# Patient Record
Sex: Female | Born: 1980 | Race: Black or African American | Hispanic: No | Marital: Single | Smoking: Current every day smoker
Health system: Southern US, Community
[De-identification: ages and names within clinical notes are randomized; demographics above are authoritative.]

## PROBLEM LIST (undated history)

## (undated) DIAGNOSIS — I10 Essential (primary) hypertension: Secondary | ICD-10-CM

## (undated) DIAGNOSIS — Z789 Other specified health status: Secondary | ICD-10-CM

## (undated) HISTORY — PX: TONSILLECTOMY: SUR1361

## (undated) HISTORY — DX: Essential (primary) hypertension: I10

## (undated) HISTORY — DX: Other specified health status: Z78.9

## (undated) HISTORY — PX: KNEE SURGERY: SHX244

---

## 2005-03-28 ENCOUNTER — Emergency Department: Payer: Self-pay | Admitting: Emergency Medicine

## 2005-04-04 ENCOUNTER — Emergency Department: Payer: Self-pay | Admitting: Emergency Medicine

## 2005-07-14 ENCOUNTER — Emergency Department: Payer: Self-pay | Admitting: Emergency Medicine

## 2005-07-16 ENCOUNTER — Emergency Department: Payer: Self-pay | Admitting: Emergency Medicine

## 2005-08-21 ENCOUNTER — Emergency Department: Payer: Self-pay | Admitting: Internal Medicine

## 2006-10-04 IMAGING — CR DG CHEST 2V
1 series · 2 of 2 positions shown · non-contrast
Comparison: none

REASON FOR EXAM: Flu like symptoms, body aches. Rm 4
COMMENTS:

[Series 1: view not recorded · 0.17mm/px · 2 of 2 slices shown]
[im 1/2]
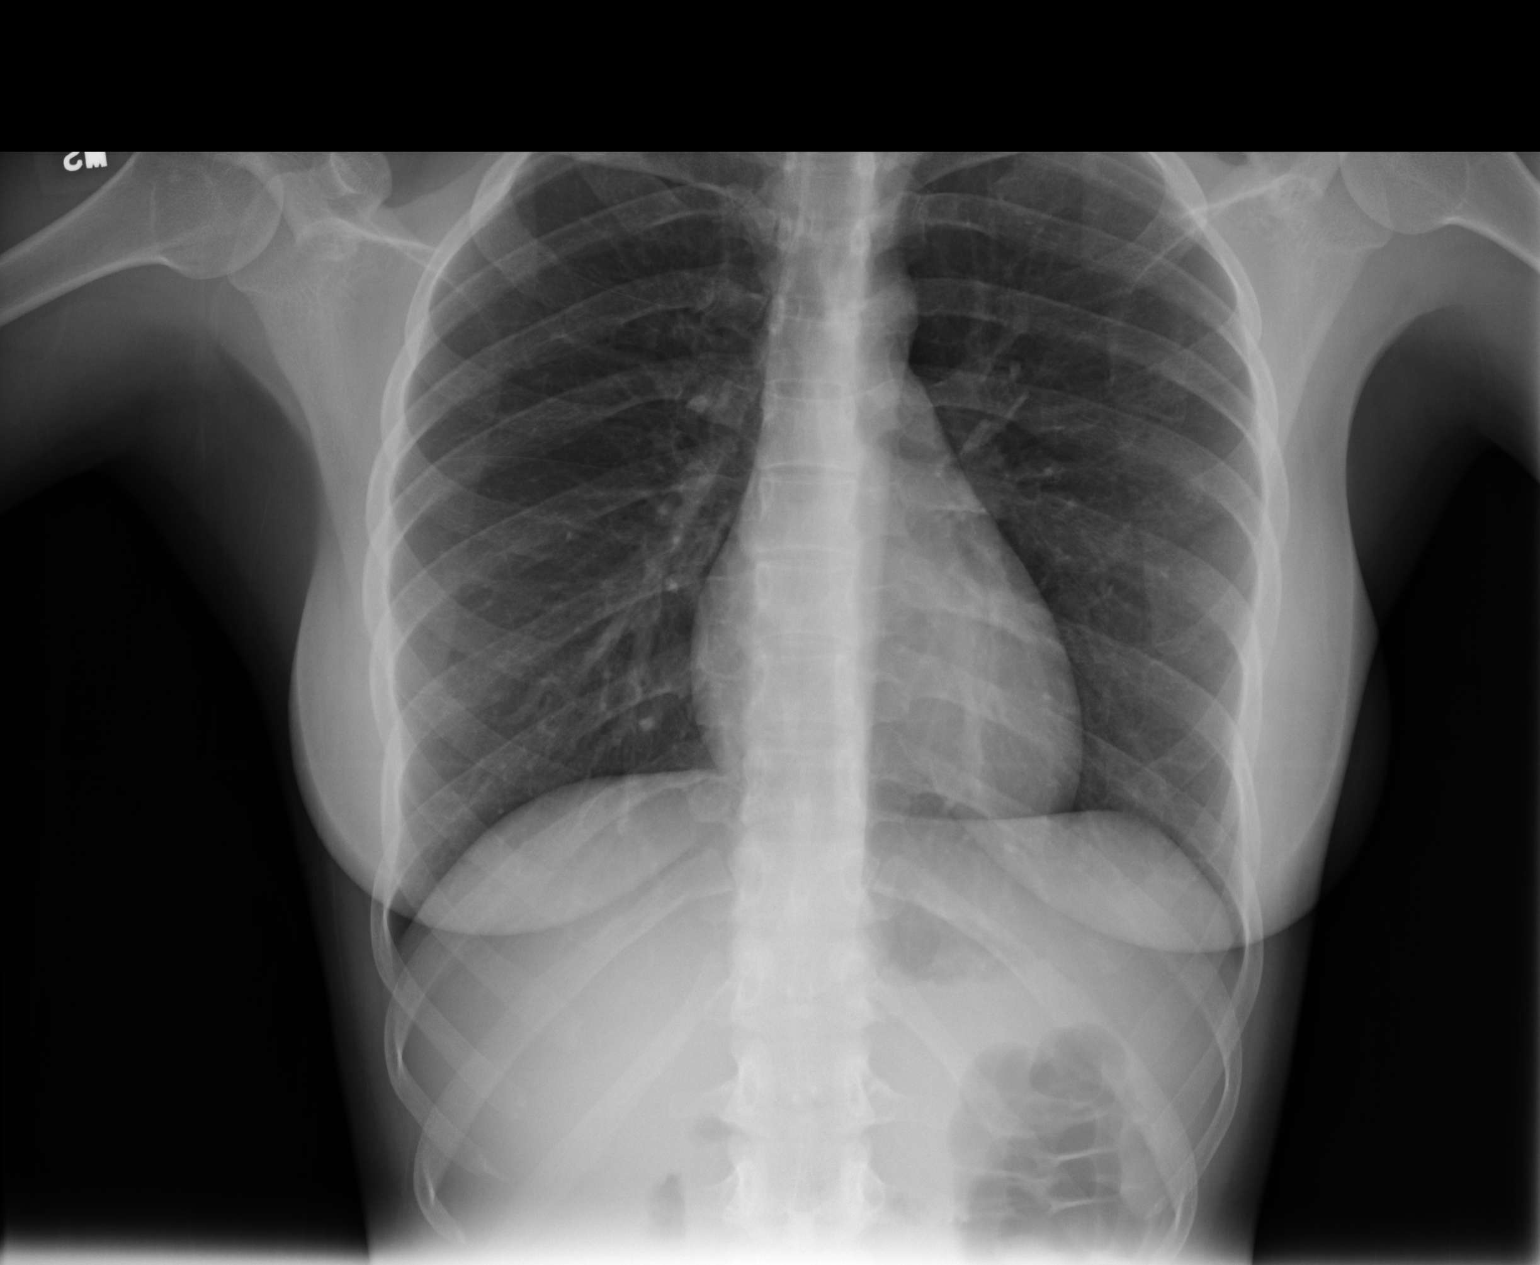
[im 2/2]
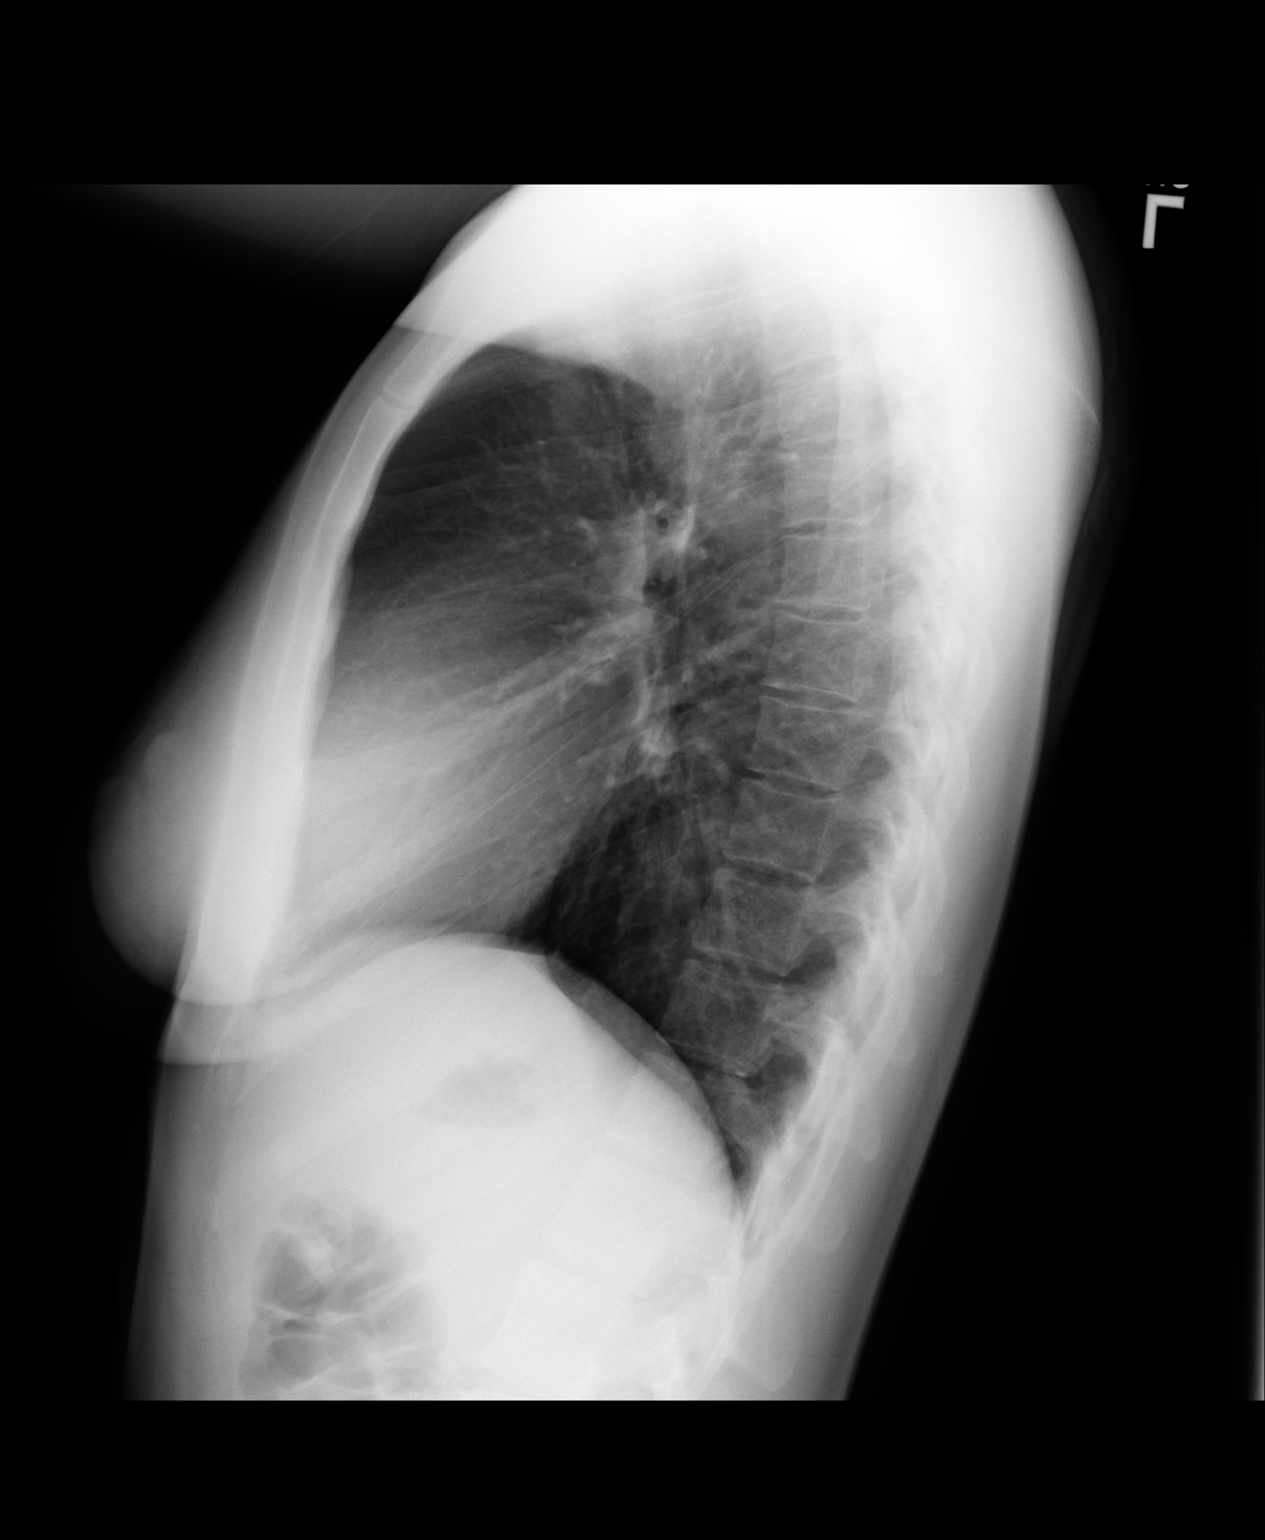

[2 of 2 positions shown; findings below may reference images not displayed]

PROCEDURE:     DXR - DXR CHEST PA (OR AP) AND LATERAL  - July 15, 2005  [DATE]

RESULT:     PA and lateral view reveals the cardiomediastinal structures to
be within normal limits.  The heart appears within normal limits in size.
The lung fields are clear.  A questionable tiny nodular density is noted in
the mid-LEFT lung field which does not appear calcified.  A chest CT would
be recommended.
IMPRESSION: No active infiltrate is seen.

Possible nodular density in the LEFT lung field.

## 2009-03-12 ENCOUNTER — Emergency Department: Payer: Self-pay | Admitting: Internal Medicine

## 2009-07-15 ENCOUNTER — Emergency Department: Payer: Self-pay | Admitting: Emergency Medicine

## 2009-08-24 ENCOUNTER — Emergency Department: Payer: Self-pay | Admitting: Emergency Medicine

## 2009-08-26 ENCOUNTER — Emergency Department: Payer: Self-pay | Admitting: Emergency Medicine

## 2010-06-17 ENCOUNTER — Emergency Department: Payer: Self-pay | Admitting: Emergency Medicine

## 2010-11-15 IMAGING — CT CT HEAD WITHOUT CONTRAST
2 series · 16 of 30 positions shown, 20 images · non-contrast
Comparison: none

REASON FOR EXAM: headache
COMMENTS:

PROCEDURE:     CT  - CT HEAD WITHOUT CONTRAST  - August 27, 2009 [DATE]
RESULT:
HISTORY: Headache.
COMPARISON STUDIES: No recent.
PROCEDURE AND FINDINGS: Non-enhanced Head CT was obtained. No intra-axial or
extra-axial pathologic fluid or blood collections identified.  No mass
lesions are noted. There is no hydrocephalus. No acute bony abnormalities
are identified.

[Series 2: without · axial · non-contrast · 0.41mm/px · z∈[-202,-82]mm · 13 of 30 slices shown, 17 images]
[im 3/30  brain]
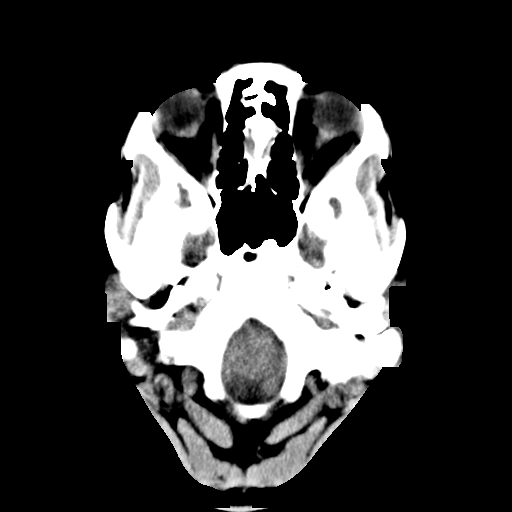
[im 3/30  bone]
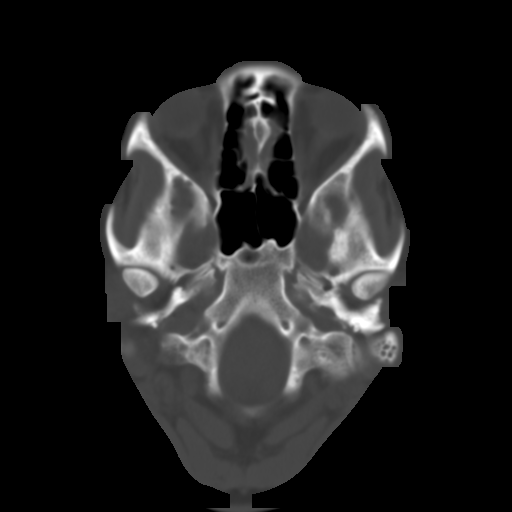
[im 5/30  brain]
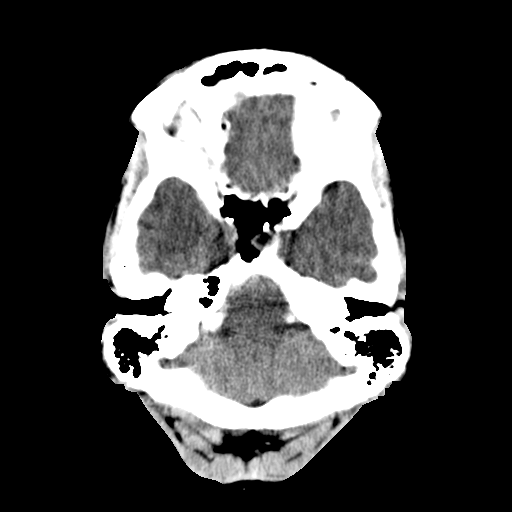
[im 7/30  brain]
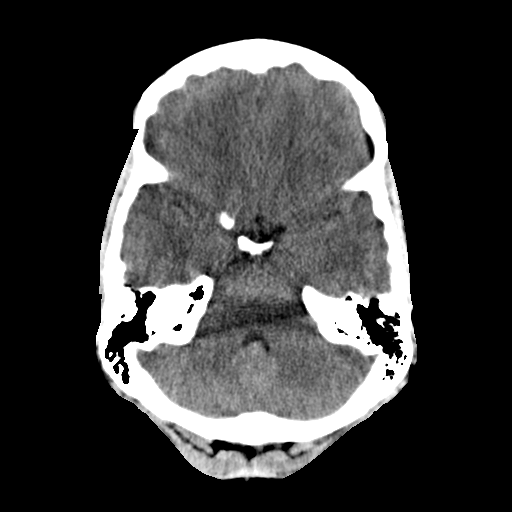
[im 9/30  brain]
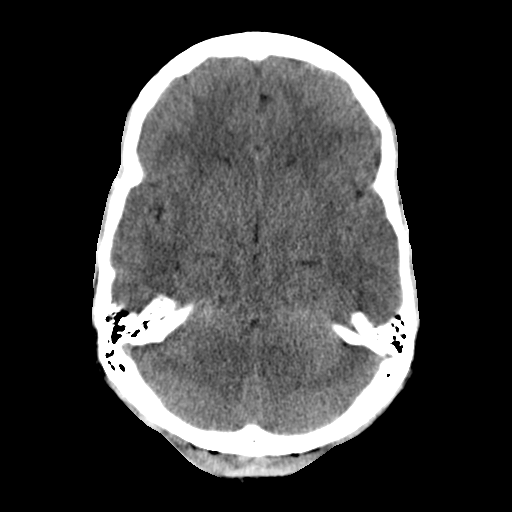
[im 11/30  brain]
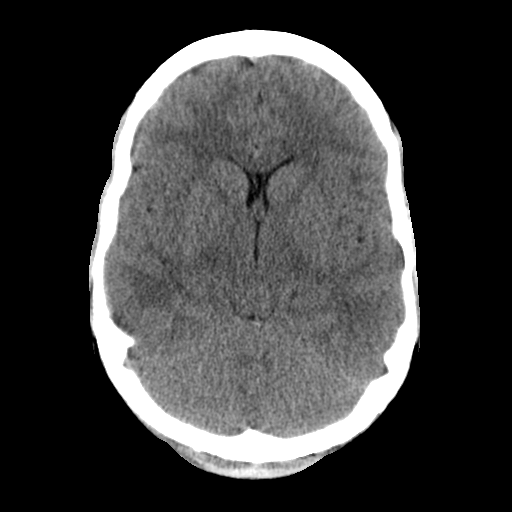
[im 11/30  bone]
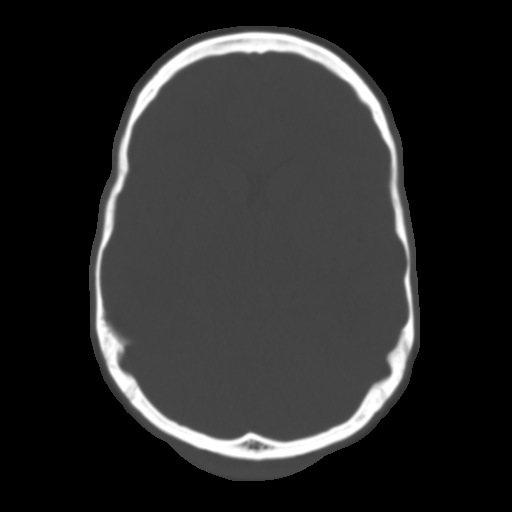
[im 13/30  brain]
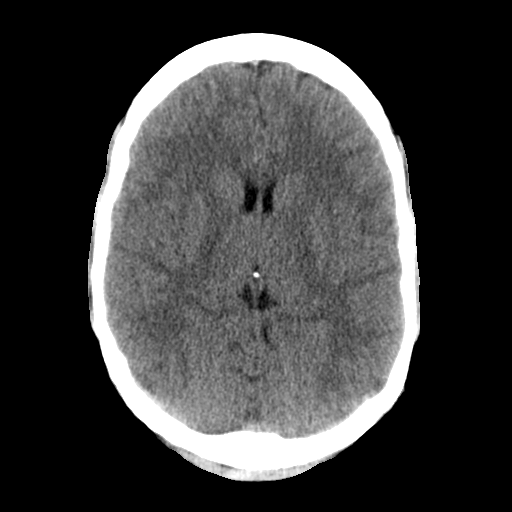
[im 15/30  brain]
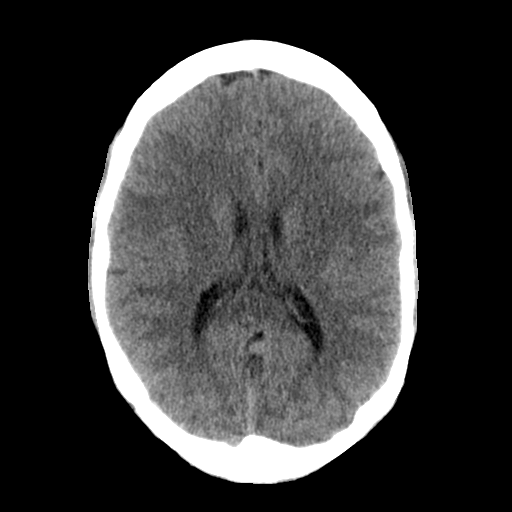
[im 17/30  brain]
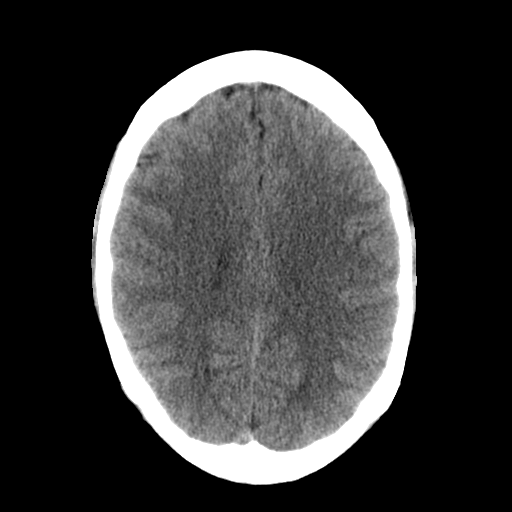
[im 19/30  brain]
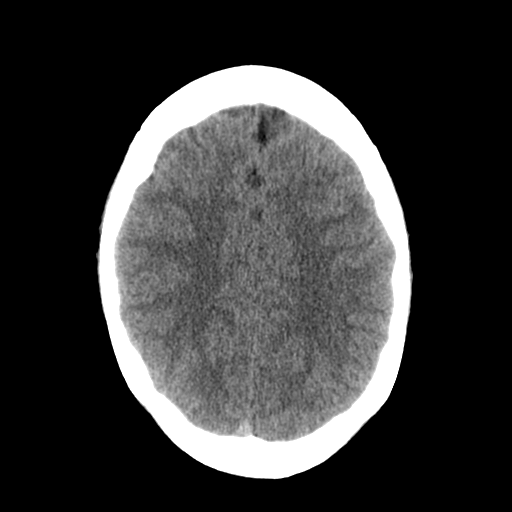
[im 19/30  bone]
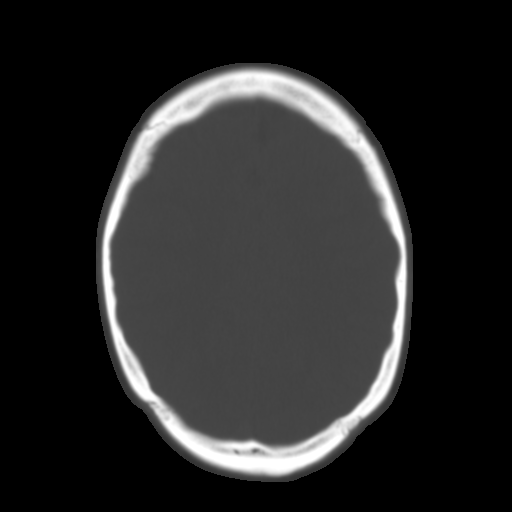
[im 21/30  brain]
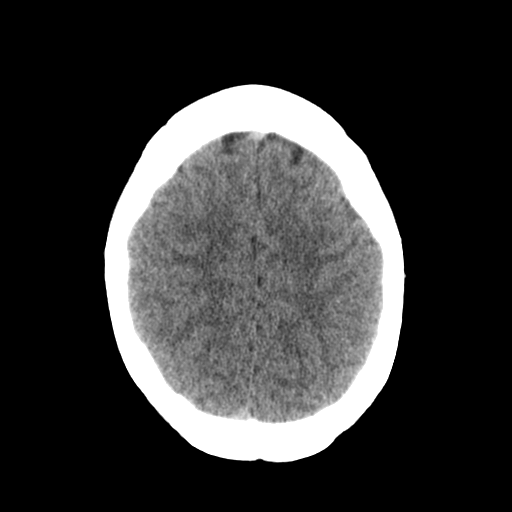
[im 23/30  brain]
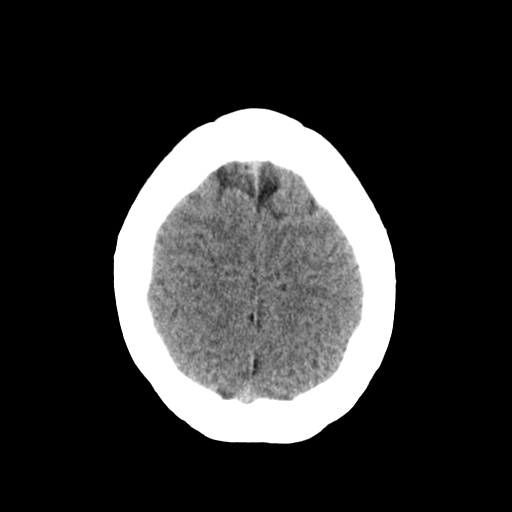
[im 25/30  brain]
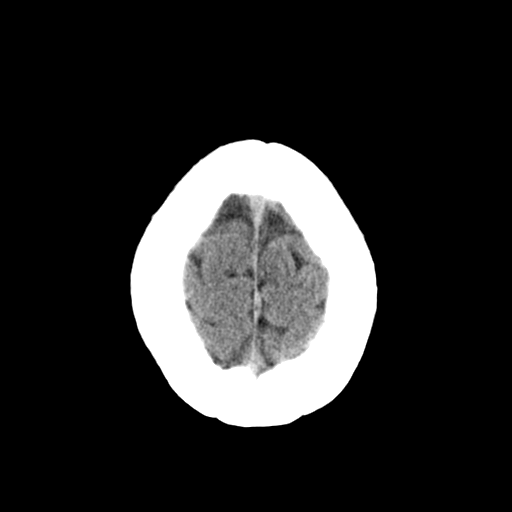
[im 27/30  brain]
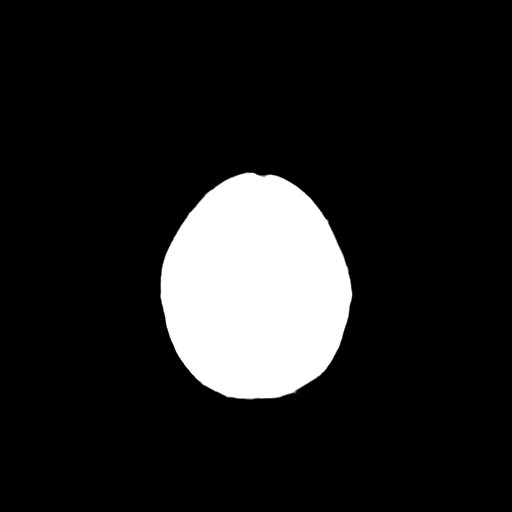
[im 27/30  bone]
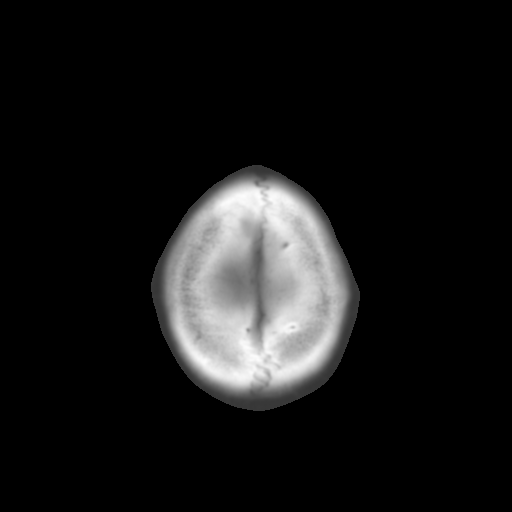

[Series 3: bone · axial · 0.41mm/px · z∈[-202,-162]mm · 3 of 30 slices shown]
[im 3/30  bone]
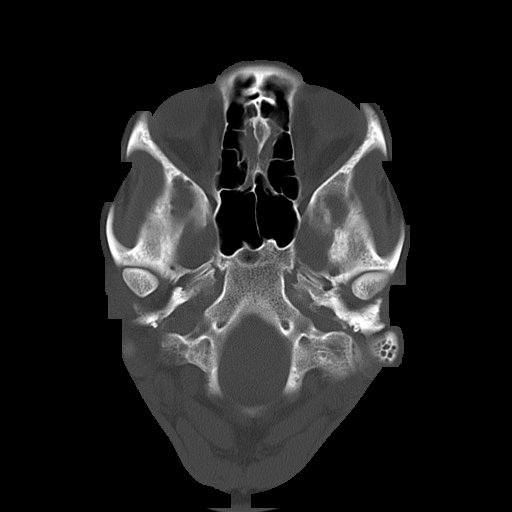
[im 7/30  bone]
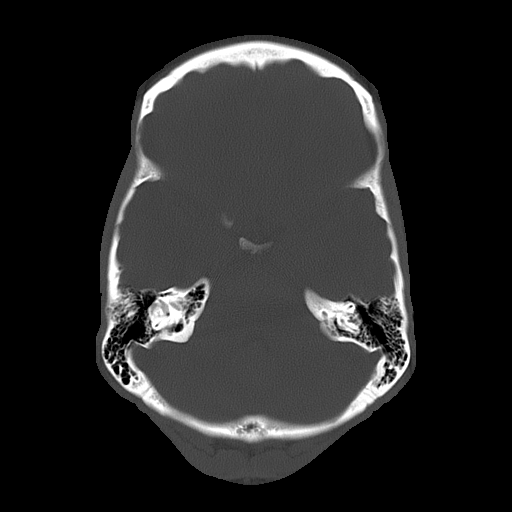
[im 11/30  bone]
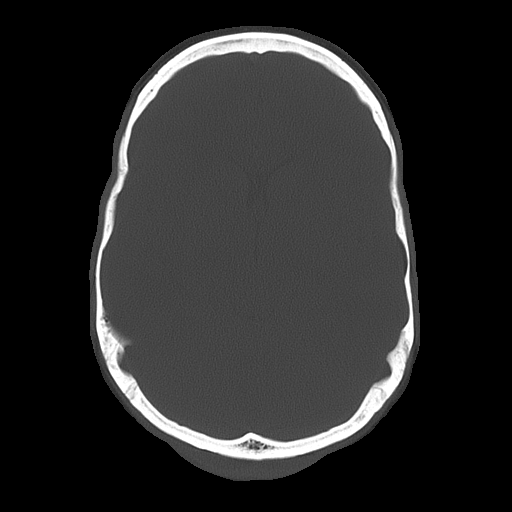

[16 of 30 positions shown; findings below may reference images not displayed]

IMPRESSION: No acute abnormality.

## 2011-01-22 ENCOUNTER — Emergency Department: Payer: Self-pay | Admitting: Emergency Medicine

## 2011-01-30 ENCOUNTER — Emergency Department: Payer: Self-pay | Admitting: Emergency Medicine

## 2011-08-25 ENCOUNTER — Inpatient Hospital Stay: Payer: Self-pay | Admitting: Obstetrics & Gynecology

## 2012-04-05 ENCOUNTER — Emergency Department: Payer: Self-pay | Admitting: Internal Medicine

## 2012-12-23 ENCOUNTER — Emergency Department: Payer: Self-pay | Admitting: Emergency Medicine

## 2013-06-23 ENCOUNTER — Emergency Department: Payer: Self-pay | Admitting: Emergency Medicine

## 2013-07-05 ENCOUNTER — Emergency Department: Payer: Self-pay | Admitting: Emergency Medicine

## 2013-10-27 ENCOUNTER — Emergency Department: Payer: Self-pay | Admitting: Emergency Medicine

## 2013-10-27 LAB — URINALYSIS, COMPLETE
BILIRUBIN, UR: NEGATIVE
Blood: NEGATIVE
Glucose,UR: NEGATIVE mg/dL (ref 0–75)
KETONE: NEGATIVE
LEUKOCYTE ESTERASE: NEGATIVE
NITRITE: NEGATIVE
Ph: 5 (ref 4.5–8.0)
Protein: NEGATIVE
RBC,UR: 3 /HPF (ref 0–5)
Specific Gravity: 1.026 (ref 1.003–1.030)
Squamous Epithelial: 4

## 2016-01-16 ENCOUNTER — Emergency Department
Admission: EM | Admit: 2016-01-16 | Discharge: 2016-01-16 | Disposition: A | Discharge status: Home / Self Care | Payer: Medicaid Other | Attending: Emergency Medicine | Admitting: Emergency Medicine

## 2016-01-16 ENCOUNTER — Encounter: Payer: Self-pay | Admitting: Medical Oncology

## 2016-01-16 DIAGNOSIS — L0291 Cutaneous abscess, unspecified: Secondary | ICD-10-CM | POA: Diagnosis present

## 2016-01-16 DIAGNOSIS — L089 Local infection of the skin and subcutaneous tissue, unspecified: Secondary | ICD-10-CM | POA: Diagnosis not present

## 2016-01-16 DIAGNOSIS — L739 Follicular disorder, unspecified: Secondary | ICD-10-CM

## 2016-01-16 DIAGNOSIS — B9689 Other specified bacterial agents as the cause of diseases classified elsewhere: Secondary | ICD-10-CM

## 2016-01-16 MED ORDER — SULFAMETHOXAZOLE-TRIMETHOPRIM 800-160 MG PO TABS: 1.0000 | ORAL_TABLET | Freq: Two times a day (BID) | ORAL | Status: AC

## 2016-01-16 MED ORDER — MUPIROCIN 2 % EX OINT: 1.0000 "application " | TOPICAL_OINTMENT | Freq: Two times a day (BID) | CUTANEOUS | Status: AC

## 2016-01-16 NOTE — Discharge Instructions (Signed)
Antibiotic Medicine Antibiotic medicines are used to treat infections caused by bacteria. They work by hurting or killing the germs that are making you sick. HOW WILL MY MEDICINE BE PICKED? There are many kinds of antibiotic medicines. To help your doctor pick one, tell your doctor if:  You have any allergies.  You are pregnant or plan to get pregnant.  You are breastfeeding.  You are taking any medicines. These include over-the-counter medicines, prescription medicines, and herbal remedies.  You have a medical condition or problem. If you have questions about why your medicine was picked, ask. FOR HOW LONG SHOULD I TAKE MY MEDICINE? Take your medicine for as long as your doctor tells you to. Do not stop taking it when you feel better. If you stop taking it too soon:  You may start to feel sick again.  Your infection may get harder to treat.  New problems may develop. WHAT IF I MISS A DOSE? Try not to miss any doses of antibiotic medicine. If you miss a dose:  Take the dose as soon as you can.  If you are taking 2 doses a day, take the next dose in 5 to 6 hours.  If you are taking 3 or more doses a day, take the next dose in 2 to 4 hours. Then go back to the normal schedule. If you cannot take a missed dose, take the next dose on time. Then take the missed dose after you have taken all the doses as told by your doctor, as if you had one more dose left. DOES THIS MEDICINE AFFECT BIRTH CONTROL? Birth control pills may not work while you are on antibiotic medicines. If you are taking birth control pills, keep taking them as usual. Use a second form of birth control, such as a condom. Keep using the second form of birth control until you are finished with your current 1 month cycle of birth control pills. GET HELP IF:  You get worse.  You do not feel better a few days after starting the medicine.  You throw up (vomit).  There are white patches in your mouth.  You have new  joint pain after starting the medicine.  You have new muscle aches after starting the medicine.  You had a fever before starting the medicine, and it comes back.  You have any symptoms of an allergic reaction, such as an itchy rash. If this happens, stop taking the medicine. GET HELP RIGHT AWAY IF:  Your pee (urine) turns dark or becomes blood-colored.  Your skin turns yellow.  You bruise or bleed easily.  You have very bad watery poop (diarrhea) and cramps in your belly (abdomen).  You have a very bad headache.  You have signs of a very bad allergic reaction, such as:  Trouble breathing.  Wheezing.  Swelling of the lips, tongue, or face.  Fainting.  Blisters on the skin or in the mouth. If you have signs of a very bad allergic reaction, stop taking the antibiotic medicine right away.   This information is not intended to replace advice given to you by your health care provider. Make sure you discuss any questions you have with your health care provider.   Document Released: 07/05/2008 Document Revised: 06/17/2015 Document Reviewed: 02/11/2015 Elsevier Interactive Patient Education 2016 Elsevier Inc.  Folliculitis Folliculitis is redness, soreness, and swelling (inflammation) of the hair follicles. This condition can occur anywhere on the body. People with weakened immune systems, diabetes, or obesity have a greater  risk of getting folliculitis. CAUSES  Bacterial infection. This is the most common cause.  Fungal infection.  Viral infection.  Contact with certain chemicals, especially oils and tars. Long-term folliculitis can result from bacteria that live in the nostrils. The bacteria may trigger multiple outbreaks of folliculitis over time. SYMPTOMS Folliculitis most commonly occurs on the scalp, thighs, legs, back, buttocks, and areas where hair is shaved frequently. An early sign of folliculitis is a small, white or yellow, pus-filled, itchy lesion (pustule).  These lesions appear on a red, inflamed follicle. They are usually less than 0.2 inches (5 mm) wide. When there is an infection of the follicle that goes deeper, it becomes a boil or furuncle. A group of closely packed boils creates a larger lesion (carbuncle). Carbuncles tend to occur in hairy, sweaty areas of the body. DIAGNOSIS  Your caregiver can usually tell what is wrong by doing a physical exam. A sample may be taken from one of the lesions and tested in a lab. This can help determine what is causing your folliculitis. TREATMENT  Treatment may include:  Applying warm compresses to the affected areas.  Taking antibiotic medicines orally or applying them to the skin.  Draining the lesions if they contain a large amount of pus or fluid.  Laser hair removal for cases of long-lasting folliculitis. This helps to prevent regrowth of the hair. HOME CARE INSTRUCTIONS  Apply warm compresses to the affected areas as directed by your caregiver.  If antibiotics are prescribed, take them as directed. Finish them even if you start to feel better.  You may take over-the-counter medicines to relieve itching.  Do not shave irritated skin.  Follow up with your caregiver as directed. SEEK IMMEDIATE MEDICAL CARE IF:   You have increasing redness, swelling, or pain in the affected area.  You have a fever. MAKE SURE YOU:  Understand these instructions.  Will watch your condition.  Will get help right away if you are not doing well or get worse.   This information is not intended to replace advice given to you by your health care provider. Make sure you discuss any questions you have with your health care provider.   Document Released: 12/05/2001 Document Revised: 10/17/2014 Document Reviewed: 12/27/2011 Elsevier Interactive Patient Education [employer] Elsevier Inc.

## 2016-01-16 NOTE — ED Notes (Signed)
Pt reports abscessed area to vaginal area.

## 2016-01-16 NOTE — ED Provider Notes (Signed)
[facility] Emergency Department Provider Note  ____________________________________________  Time seen: Approximately 10:19 AM  I have reviewed the triage vital signs and the nursing notes.   HISTORY  Chief Complaint Abscess    HPI Samantha Baker is a 35 y.o. female, NAD, presents to the emergency department with a few day history of skin sores to the vaginal area. States she had recurrent skin sores after shaving the area in the past, therefore she has stopped doing so. Over the last week she has noted ingrown hairs and small bumps that have occurred. One area is about the left labia which has been painful. Has not had any oozing or weeping. Patient has not pushed on these areas nor attempted to express fluid from them at home. States she has not been sexually active and well over 2 years as her significant other is currently incarcerated. She denies any pain or open wounds about the area. No history of STDs. Denies fever, chills, body aches, abdominal pain, nausea, vomiting. Has not had any changes in urinary patterns.   History reviewed. No pertinent past medical history.  There are no active problems to display for this patient.   No past surgical history on file.  Current Outpatient Rx  Name  Route  Sig  Dispense  Refill  . mupirocin ointment (BACTROBAN) 2 %   Topical   Apply 1 application topically 2 (two) times daily.   30 g   0   . sulfamethoxazole-trimethoprim (BACTRIM DS,SEPTRA DS) 800-160 MG tablet   Oral   Take 1 tablet by mouth 2 (two) times daily.   20 tablet   0     Allergies Penicillins  No family history on file.  Social History Social History  Substance Use Topics  . Smoking status: Never Smoker   . Smokeless tobacco: None  . Alcohol Use: None     Review of Systems  Constitutional: No fever/chills, fatigue Cardiovascular: No chest pain. Respiratory:  No shortness of breath.  Gastrointestinal: No abdominal pain.  No  nausea, vomiting. Genitourinary: Negative for dysuria, hematuria. No urinary hesitancy, urgency or increased frequency. Musculoskeletal: Negative for back, hip pain.  Skin: Positive skin sores. Negative for rash. Neurological: Negative for headaches, focal weakness or numbness. 10-point ROS otherwise negative.  ____________________________________________   PHYSICAL EXAM:  VITAL SIGNS: ED Triage Vitals  Enc Vitals Group     BP 01/16/16 0917 125/91 mmHg     Pulse Rate 01/16/16 0917 99     Resp 01/16/16 0917 18     Temp 01/16/16 0917 98.7 F (37.1 C)     Temp Source 01/16/16 0917 Oral     SpO2 01/16/16 0917 100 %     Weight 01/16/16 0917 190 lb (86.183 kg)     Height 01/16/16 0917  (1.651 m)     Head Cir --      Peak Flow --      Pain Score 01/16/16 0917 0     Pain Loc --      Pain Edu? --      Excl. in GC? --    Physical exam completed in the presence of Samantha Fill, PA-S  Constitutional: Alert and oriented. Well appearing and in no acute distress. Eyes: Conjunctivae are normal.  Head: Atraumatic. Hematological/Lymphatic/Immunilogical: No inguinal lymphadenopathy. Cardiovascular:  Good peripheral circulation. Respiratory: Normal respiratory effort without tachypnea or retractions.  Genitourinary:  Tanner stage V. Musculoskeletal: No lower extremity tenderness nor edema.  No joint effusions. Neurologic:  Normal speech and language. No gross focal neurologic deficits are appreciated.  Skin:  2 skin sores surrounding hair follicles noted about the vulva with 1 skin sore about the left labia also surrounding hair follicles. No active oozing or weeping. Skin sore about the left labia is mildly tender to palpation but no induration or fluctuance was noted. Skin is warm, dry and intact. No rash noted. Psychiatric: Mood and affect are normal. Speech and behavior are normal. Patient exhibits appropriate insight and  judgement.   ____________________________________________   LABS  None  ____________________________________________  EKG  None ____________________________________________  RADIOLOGY  None ____________________________________________    PROCEDURES  Procedure(s) performed: None    Medications - No data to display   ____________________________________________   INITIAL IMPRESSION / ASSESSMENT AND PLAN / ED COURSE  Patient's diagnosis is consistent with folliculitis and localized skin infection. Patient will be discharged home with prescriptions for Bactrim DS and bacitracin ointment to use as directed. Patient is to follow up with PCP if symptoms persist past this treatment course. Patient is given ED precautions to return to the ED for any worsening or new symptoms.    ____________________________________________  FINAL CLINICAL IMPRESSION(S) / ED DIAGNOSES  Final diagnoses:  Folliculitis  Localized bacterial skin infection      NEW MEDICATIONS STARTED DURING THIS VISIT:  Discharge Medication List as of 01/16/2016 10:25 AM    START taking these medications   Details  mupirocin ointment (BACTROBAN) 2 % Apply 1 application topically 2 (two) times daily., Starting 01/16/2016, Until Discontinued, Print    sulfamethoxazole-trimethoprim (BACTRIM DS,SEPTRA DS) 800-160 MG tablet Take 1 tablet by mouth 2 (two) times daily., Starting 01/16/2016, Until Discontinued, Print             Samantha PigeonJami L Hagler, PA-C 01/16/16 1153  Samantha Blazeravid Matthew Schaevitz, MD 01/16/16 [phone]

## 2016-01-16 NOTE — ED Notes (Signed)
NAD noted at time of D/C. Pt denies questions or concerns. Pt ambulatory to the lobby at this time.  

## 2017-03-21 ENCOUNTER — Encounter: Payer: Self-pay | Admitting: *Deleted

## 2017-03-21 ENCOUNTER — Emergency Department
Admission: EM | Admit: 2017-03-21 | Discharge: 2017-03-21 | Disposition: A | Discharge status: Home / Self Care | Payer: Medicaid Other | Attending: Emergency Medicine | Admitting: Emergency Medicine

## 2017-03-21 DIAGNOSIS — L739 Follicular disorder, unspecified: Secondary | ICD-10-CM | POA: Diagnosis not present

## 2017-03-21 DIAGNOSIS — N9089 Other specified noninflammatory disorders of vulva and perineum: Secondary | ICD-10-CM | POA: Diagnosis present

## 2017-03-21 NOTE — Discharge Instructions (Signed)
Please continue with antibiotics and follow up as needed for any increase in pain worsening symptoms urgent changes in her health.

## 2017-03-21 NOTE — ED Triage Notes (Signed)
Pt was seen at [facility] yesterday and dx with bartholins cyst  Pt taking septra.  Pt states it's not any better.  Pt alert.

## 2017-03-21 NOTE — ED Provider Notes (Signed)
[facility]-EMERGENCY DEPARTMENT Provider Note   CSN: [id number] Arrival date & time: 03/21/17  1915     History   Chief Complaint Chief Complaint  Patient presents with  . Bartholin's Cyst    HPI Samantha Baker is a 36 y.o. female presents to the emergency department today for concern of a Bartholin's cyst. Was seen one day ago diagnosed with Bartholin's cyst and placed on Bactrim DS. Today, patient states she has a lesion that is concerning for herpes. She denies any pain, warmth, erythema, swelling or drainage to the area. She has been taking Bactrim for the last 24 hours. Patient states initially when this lesion started there is a small pimple, she squeezed the area and removed some pus. No fevers, pain, vaginal discharge, dyspareunia.  HPI  No past medical history on file.  There are no active problems to display for this patient.   No past surgical history on file.  OB History    No data available       Home Medications    Prior to Admission medications   Medication Sig Start Date End Date Taking? Authorizing Provider  mupirocin ointment (BACTROBAN) 2 % Apply 1 application topically 2 (two) times daily. 01/16/16   Hagler, Jami L, PA-C  sulfamethoxazole-trimethoprim (BACTRIM DS,SEPTRA DS) 800-160 MG tablet Take 1 tablet by mouth 2 (two) times daily. 01/16/16   Hagler, Ernestene KielJami L, PA-C    Family History No family history on file.  Social History Social History  Substance Use Topics  . Smoking status: Never Smoker  . Smokeless tobacco: Never Used  . Alcohol use No     Allergies   Penicillins   Review of Systems Review of Systems  Constitutional: Negative for activity change, chills, fatigue and fever.  HENT: Negative for congestion, sinus pressure and sore throat.   Eyes: Negative for visual disturbance.  Respiratory: Negative for cough, chest tightness and shortness of breath.   Cardiovascular: Negative for chest pain and leg swelling.  Gastrointestinal: Negative  for abdominal pain, diarrhea, nausea and vomiting.  Genitourinary: Negative for dysuria.  Musculoskeletal: Negative for arthralgias and gait problem.  Skin: Positive for wound. Negative for rash.  Neurological: Negative for weakness, numbness and headaches.  Hematological: Negative for adenopathy.  Psychiatric/Behavioral: Negative for agitation, behavioral problems and confusion.     Physical Exam Updated Vital Signs BP 138/82 (BP Location: Left Arm)   Pulse 87   Temp 99.7 F (37.6 C) (Oral)   Resp 18   Ht 5\' 4"  (1.626 m)   Wt 83.9 kg (185 lb)   SpO2 100%   BMI 31.76 kg/m   Physical Exam  Constitutional: She is oriented to person, place, and time. She appears well-developed and well-nourished. No distress.  HENT:  Head: Normocephalic and atraumatic.  Mouth/Throat: Oropharynx is clear and moist.  Eyes: EOM are normal. Pupils are equal, round, and reactive to light. Right eye exhibits no discharge. Left eye exhibits no discharge.  Neck: Normal range of motion. Neck supple.  Cardiovascular: Normal rate, regular rhythm and intact distal pulses.   Pulmonary/Chest: Effort normal. No respiratory distress.  Abdominal: Soft. She exhibits no distension. There is no tenderness.  Genitourinary:  Genitourinary Comments: Examination of the external genitalia shows a small skin lesion along the right upper labia with no swelling, tenderness, fluctuance or induration. She is nontender to palpation. There is no vaginal discharge. No swelling along the Bartholin's region.  Musculoskeletal: Normal range of motion. She exhibits no edema.  Neurological: She is  alert and oriented to person, place, and time. She has normal reflexes.  Skin: Skin is warm and dry.  Psychiatric: She has a normal mood and affect. Her behavior is normal. Thought content normal.     ED Treatments / Results  Labs (all labs ordered are listed, but only abnormal results are displayed) Labs Reviewed - No data to  display  EKG  EKG Interpretation None       Radiology No results found.  Procedures Procedures (including critical care time)  Medications Ordered in ED Medications - No data to display   Initial Impression / Assessment and Plan / ED Course  I have reviewed the triage vital signs and the nursing notes.  Pertinent labs & imaging results that were available during my care of the patient were reviewed by me and considered in my medical decision making (see chart for details).     36 year old female with a lesion to the right vaginal area. There does not appear to be consistent with a Bartholin's cyst or herpes. Area is nontender to palpation and there is no swelling induration or fluctuance. History and current exam consistent with healing skin folliculitis. She'll continue Bactrim and follow up as needed.  Final Clinical Impressions(s) / ED Diagnoses   Final diagnoses:  Acute folliculitis    New Prescriptions New Prescriptions   No medications on file     Ronnette Juniper 03/21/17 1955    Merrily Brittle, MD 03/21/17 2043

## 2017-03-21 NOTE — ED Notes (Signed)
Alert, oriented, ambulatory. Pt asking for 2nd opinion. States she has had cysts in vaginal area before but states this one looks different. [facility]. Taken 2 doses. Thinks appearance of cyst has changed since yest. States he pulled a hair out of it last night.

## 2019-06-18 ENCOUNTER — Other Ambulatory Visit: Payer: Self-pay

## 2019-06-18 ENCOUNTER — Ambulatory Visit (LOCAL_COMMUNITY_HEALTH_CENTER): Payer: Medicaid Other

## 2019-06-18 VITALS — BP 138/88 | Ht 62.0 in | Wt 215.0 lb

## 2019-06-18 DIAGNOSIS — Z30013 Encounter for initial prescription of injectable contraceptive: Secondary | ICD-10-CM | POA: Diagnosis not present

## 2019-06-18 DIAGNOSIS — Z3009 Encounter for other general counseling and advice on contraception: Secondary | ICD-10-CM | POA: Diagnosis not present

## 2019-06-18 MED ORDER — MEDROXYPROGESTERONE ACETATE 150 MG/ML IM SUSP
150.0000 mg | Freq: Once | INTRAMUSCULAR | Status: AC
  Administered 2019-06-18: 16:00:00 150 mg via INTRAMUSCULAR

## 2019-06-18 NOTE — Progress Notes (Signed)
Last physical with [facility] 12/21/2018. Last depo with [facility] 03/22/2019; 12.4 weeks post depo. PAP due 04/2019 per [facility] records/notes (04/21/2014 PAP Normal, HPV negative). DMPA 150 mg IM per Jerline Pain, FNP order dated 12/21/2018.

## 2019-09-03 ENCOUNTER — Ambulatory Visit (LOCAL_COMMUNITY_HEALTH_CENTER): Payer: Medicaid Other

## 2019-09-03 ENCOUNTER — Other Ambulatory Visit: Payer: Self-pay

## 2019-09-03 VITALS — BP 132/78 | Ht 62.0 in | Wt 214.5 lb

## 2019-09-03 DIAGNOSIS — Z30013 Encounter for initial prescription of injectable contraceptive: Secondary | ICD-10-CM

## 2019-09-03 DIAGNOSIS — Z3009 Encounter for other general counseling and advice on contraception: Secondary | ICD-10-CM | POA: Diagnosis not present

## 2019-09-03 DIAGNOSIS — Z3042 Encounter for surveillance of injectable contraceptive: Secondary | ICD-10-CM

## 2019-09-03 MED ORDER — MEDROXYPROGESTERONE ACETATE 150 MG/ML IM SUSP
150.0000 mg | Freq: Once | INTRAMUSCULAR | Status: AC
  Administered 2019-09-03: 150 mg via INTRAMUSCULAR

## 2019-09-03 MED ORDER — MULTIVITAMINS PO CAPS: 1.0000 | ORAL_CAPSULE | Freq: Every day | ORAL | 0 refills | Status: AC

## 2019-09-03 NOTE — Progress Notes (Signed)
Last depo at [facility] was 06/18/2019; 11.0 weeks post depo today. DMPA 150 mg IM per Jerline Pain, FNP order dated 12/21/2018 (at [facility]).

## 2019-11-21 ENCOUNTER — Ambulatory Visit (LOCAL_COMMUNITY_HEALTH_CENTER): Payer: Medicaid Other

## 2019-11-21 ENCOUNTER — Other Ambulatory Visit: Payer: Self-pay

## 2019-11-21 VITALS — BP 127/82 | Ht 62.0 in | Wt 214.2 lb

## 2019-11-21 DIAGNOSIS — Z30013 Encounter for initial prescription of injectable contraceptive: Secondary | ICD-10-CM | POA: Diagnosis not present

## 2019-11-21 DIAGNOSIS — Z3009 Encounter for other general counseling and advice on contraception: Secondary | ICD-10-CM

## 2019-11-21 DIAGNOSIS — Z3042 Encounter for surveillance of injectable contraceptive: Secondary | ICD-10-CM

## 2019-11-21 MED ORDER — MEDROXYPROGESTERONE ACETATE 150 MG/ML IM SUSP
150.0000 mg | Freq: Once | INTRAMUSCULAR | Status: AC
  Administered 2019-11-21: 10:00:00 150 mg via INTRAMUSCULAR

## 2019-11-21 NOTE — Addendum Note (Signed)
Addended by: Tawny Hopping A on: 11/21/2019 11:43 AM   Modules accepted: Level of Service

## 2019-11-21 NOTE — Progress Notes (Signed)
Here today for Depo. Last PE was 12/21/2018 and last Pap was 04/11/2014. Depo given per 12/21/2018 order. Patient tolerated well. To return around 02/06/2020 for Depo and Physical. Tawny Hopping, RN

## 2020-02-14 ENCOUNTER — Other Ambulatory Visit: Payer: Self-pay

## 2020-02-14 ENCOUNTER — Ambulatory Visit (LOCAL_COMMUNITY_HEALTH_CENTER): Payer: Medicaid Other

## 2020-02-14 VITALS — BP 130/85 | Ht 62.0 in | Wt 216.5 lb

## 2020-02-14 DIAGNOSIS — Z30013 Encounter for initial prescription of injectable contraceptive: Secondary | ICD-10-CM | POA: Diagnosis not present

## 2020-02-14 DIAGNOSIS — Z3042 Encounter for surveillance of injectable contraceptive: Secondary | ICD-10-CM

## 2020-02-14 DIAGNOSIS — Z3009 Encounter for other general counseling and advice on contraception: Secondary | ICD-10-CM | POA: Diagnosis not present

## 2020-02-14 MED ORDER — MEDROXYPROGESTERONE ACETATE 150 MG/ML IM SUSP
150.0000 mg | Freq: Once | INTRAMUSCULAR | Status: AC
  Administered 2020-02-14: 150 mg via INTRAMUSCULAR

## 2020-02-14 NOTE — Progress Notes (Signed)
Pt is 12.1 weeks post depo today. DMPA 150 mg IM administered today per Dr. Lyndel Safe standing order for one time depo when physical is due (reference Jerilee Hoh, FNP order for 1 year of depo dated 01/08/2019 in [facility]).

## 2020-05-01 ENCOUNTER — Ambulatory Visit (LOCAL_COMMUNITY_HEALTH_CENTER): Payer: Medicaid Other | Admitting: Advanced Practice Midwife

## 2020-05-01 ENCOUNTER — Other Ambulatory Visit: Payer: Self-pay

## 2020-05-01 VITALS — BP 133/88 | Ht 62.0 in

## 2020-05-01 DIAGNOSIS — R030 Elevated blood-pressure reading, without diagnosis of hypertension: Secondary | ICD-10-CM

## 2020-05-01 DIAGNOSIS — E669 Obesity, unspecified: Secondary | ICD-10-CM

## 2020-05-01 DIAGNOSIS — Z3042 Encounter for surveillance of injectable contraceptive: Secondary | ICD-10-CM

## 2020-05-01 DIAGNOSIS — Z3009 Encounter for other general counseling and advice on contraception: Secondary | ICD-10-CM | POA: Diagnosis not present

## 2020-05-01 DIAGNOSIS — Z30013 Encounter for initial prescription of injectable contraceptive: Secondary | ICD-10-CM | POA: Diagnosis not present

## 2020-05-01 DIAGNOSIS — F17200 Nicotine dependence, unspecified, uncomplicated: Secondary | ICD-10-CM

## 2020-05-01 MED ORDER — MEDROXYPROGESTERONE ACETATE 150 MG/ML IM SUSP
150.0000 mg | Freq: Once | INTRAMUSCULAR | Status: AC
  Administered 2020-05-01: 150 mg via INTRAMUSCULAR

## 2020-05-01 NOTE — Progress Notes (Signed)
Wet mount reviewed, no tx per standing order. Provided pt with BP readings from today. Provider reviewed BP. Pt has PCP list. Per provider, no instructions to follow-up with PCP at this time.  Reviewed some common signs and symptoms of high blood pressure, pt has cuff at home to be able to monitor BP. Provider orders completed.

## 2020-05-01 NOTE — Progress Notes (Signed)
[facility] DEPARTMENT [facility] [address] Main Number: [phone]    Family Planning Visit- Initial Visit  Subjective:  Samantha Baker is a 39 y.o. engaged BF smoker G1P1001 (66 yo son)  being seen today for an initial well woman visit and to discuss family planning options.  Not working.  Living with son, fiance, and nephew who she has custody of.    She is currently using Depo Provera for pregnancy prevention. Patient reports she does not want a pregnancy in the next year.  Patient has the following medical conditions has Obesity 216 lbs; Elevated BP without diagnosis of hypertension 130/89 on 05/01/20; and Smoker 1/2-1 ppd on their problem list.  Chief Complaint  Patient presents with  . Annual Exam  . Contraception    DEPO (in hip)    Patient reports no LMP on DMPA.  Last pap 04/21/14 neg.  Last sex 04/23/20 with condom; with current partner x 10 years.  Last ETOH 03/29/20 (3 glasses wine) 1x/mo.  Last DMPA 02/14/20  Patient denies drug use.  Body mass index is 39.6 kg/m. - Patient is eligible for diabetes screening based on BMI and age >22?  not applicable HA1C ordered? not applicable  Patient reports 1 of partners in last year. Desires STI screening?  Yes  Has patient been screened once for HCV in the past?  No  No results found for: HCVAB  Does the patient have current drug use (including MJ), have a partner with drug use, and/or has been incarcerated since last result? No  If yes-- Screen for HCV through [facility]   Does the patient meet criteria for HBV testing? No  Criteria:  -Household, sexual or needle sharing contact with HBV -History of drug use -HIV positive -Those with known Hep C   Health Maintenance Due  Topic Date Due  . Hepatitis C Screening  Never done  . COVID-19 Vaccine (1) Never done  . HIV Screening  Never done  . TETANUS/TDAP  Never done  . PAP SMEAR-Modifier  Never done    Review of Systems  All  other systems reviewed and are negative.   The following portions of the patient's history were reviewed and updated as appropriate: allergies, current medications, past family history, past medical history, past social history, past surgical history and problem list. Problem list updated.   See flowsheet for other program required questions.  Objective:   Vitals:   05/01/20 1552 05/01/20 1604  BP: (!) 141/86 (!) 130/89  Height: 5\' 2"  (1.575 m)     Physical Exam Constitutional:      Appearance: Normal appearance. She is obese.  HENT:     Head: Normocephalic and atraumatic.     Mouth/Throat:     Mouth: Mucous membranes are moist.  Eyes:     Conjunctiva/sclera: Conjunctivae normal.  Cardiovascular:     Rate and Rhythm: Normal rate and regular rhythm.  Pulmonary:     Effort: Pulmonary effort is normal.     Breath sounds: Normal breath sounds.  Abdominal:     Palpations: Abdomen is soft.     Comments: Soft without tenderness, increased adipose, poor tone  Genitourinary:    General: Normal vulva.     Exam position: Lithotomy position.     Vagina: Bleeding (red spotting) present.     Cervix: Normal.     Uterus: Normal.      Adnexa: Right adnexa normal and left adnexa normal.  Rectum: Normal.     Comments: Pap done Musculoskeletal:        General: Normal range of motion.     Cervical back: Normal range of motion and neck supple.  Skin:    General: Skin is warm and dry.  Neurological:     Mental Status: She is alert.  Psychiatric:        Mood and Affect: Mood normal.       Assessment and Plan:  Samantha Baker is a 39 y.o. female presenting to the [facility] Department for an initial well woman exam/family planning visit  Contraception counseling: Reviewed all forms of birth control options in the tiered based approach. available including abstinence; over the counter/barrier methods; hormonal contraceptive medication including pill, patch, ring,  injection,contraceptive implant, ECP; hormonal and nonhormonal IUDs; permanent sterilization options including vasectomy and the various tubal sterilization modalities. Risks, benefits, and typical effectiveness rates were reviewed.  Questions were answered.  Written information was also given to the patient to review.  Patient desires DMPA, this was prescribed for patient. She will follow up in 11-13 wks for surveillance.  She was told to call with any further questions, or with any concerns about this method of contraception.  Emphasized use of condoms 100% of the time for STI prevention.  Patient was not offered ECP. ECP was not accepted by the patient. ECP counseling was not given - see RN documentation  1. Family planning Please repeat BP (130/89) Treat wet mount per standing orders Immunization nurse consult - WET PREP FOR TRICH, YEAST, CLUE - Chlamydia/Gonorrhea [facility] [facility] - IGP, Aptima HPV  2. Encounter for surveillance of injectable contraceptive DMPA 150 mg IM q 11-13 wks x 1 year Counseled via 5 A's to stop smoking  3. Obesity, unspecified classification, unspecified obesity type, unspecified whether serious comorbidity present      Return for 11-13 wk DMPA.  No future appointments.  Alberteen Spindle, CNM

## 2020-05-01 NOTE — Progress Notes (Signed)
Pt to clinic for physical and depo. Pt is 11.0 weeks post depo today. 

## 2020-05-02 LAB — WET PREP FOR TRICH, YEAST, CLUE
Trichomonas Exam: NEGATIVE
Yeast Exam: NEGATIVE

## 2020-05-06 LAB — IGP, APTIMA HPV
HPV Aptima: NEGATIVE
PAP Smear Comment: 0

## 2020-07-17 ENCOUNTER — Ambulatory Visit (LOCAL_COMMUNITY_HEALTH_CENTER): Payer: Medicaid Other

## 2020-07-17 ENCOUNTER — Other Ambulatory Visit: Payer: Self-pay

## 2020-07-17 VITALS — BP 139/97 | Ht 62.0 in | Wt 217.0 lb

## 2020-07-17 DIAGNOSIS — Z3009 Encounter for other general counseling and advice on contraception: Secondary | ICD-10-CM

## 2020-07-17 DIAGNOSIS — Z30013 Encounter for initial prescription of injectable contraceptive: Secondary | ICD-10-CM | POA: Diagnosis not present

## 2020-07-17 DIAGNOSIS — Z3042 Encounter for surveillance of injectable contraceptive: Secondary | ICD-10-CM

## 2020-07-17 MED ORDER — MEDROXYPROGESTERONE ACETATE 150 MG/ML IM SUSP
150.0000 mg | Freq: Once | INTRAMUSCULAR | Status: AC
  Administered 2020-07-17: 150 mg via INTRAMUSCULAR

## 2020-07-17 NOTE — Progress Notes (Signed)
11 weeks post depo. BP  elevated today at 139/97 and retaken 10 min. later at 140/95. Pt reports smoking and drinking a pepsi directly before appt today. States she takes her BP at home and it has always been "normal", but did not know the BP numbers. States her blood pressure rises when she is at doctor's office. Consult with Beatris Si, PA who says ok to proceed with depo today per order by Hazle Coca, CNM dated 05/01/20. DMPA 150mg  IM given today Left UOQ without difficulty. RN counseled pt on healthy lifestyle changes, such as exercise, reducing sodium and fat in diet, and no smoking. Pt agrees to keep record of BP she takes at home and to see PCP if questions/concerns.  Depo due 10/02/20.  Has depo reminder card  10/04/20, RN

## 2020-07-17 NOTE — Progress Notes (Signed)
Consulted by RN re:  Patient with elevated BP and here for Depo.  Reviewed RN note and agree that it reflects discussion, recommendations and plan.

## 2020-10-06 ENCOUNTER — Ambulatory Visit (LOCAL_COMMUNITY_HEALTH_CENTER): Payer: Medicaid Other

## 2020-10-06 ENCOUNTER — Other Ambulatory Visit: Payer: Self-pay

## 2020-10-06 VITALS — BP 128/83 | Ht 62.0 in | Wt 215.0 lb

## 2020-10-06 DIAGNOSIS — Z30013 Encounter for initial prescription of injectable contraceptive: Secondary | ICD-10-CM | POA: Diagnosis not present

## 2020-10-06 DIAGNOSIS — Z3009 Encounter for other general counseling and advice on contraception: Secondary | ICD-10-CM

## 2020-10-06 DIAGNOSIS — Z3042 Encounter for surveillance of injectable contraceptive: Secondary | ICD-10-CM

## 2020-10-06 MED ORDER — MEDROXYPROGESTERONE ACETATE 150 MG/ML IM SUSP
150.0000 mg | Freq: Once | INTRAMUSCULAR | Status: AC
  Administered 2020-10-06: 150 mg via INTRAMUSCULAR

## 2020-10-06 NOTE — Progress Notes (Signed)
11 weeks 4 days post depo. Voices no problems. Reports she has been checking BP at home and it has been <140/90 consistently. States she has not seen PCP for BP evaluation. BP today WNL at 128/83. DMPA 150 mg IM given today Right UOQ per order by Hazle Coca, CNM dated 05/01/2020. Tolerated well. Next depo due 12/22/2020, pt aware. Jerel Shepherd, RN

## 2020-12-24 ENCOUNTER — Other Ambulatory Visit: Payer: Self-pay

## 2020-12-24 ENCOUNTER — Ambulatory Visit (LOCAL_COMMUNITY_HEALTH_CENTER): Payer: Medicaid Other

## 2020-12-24 VITALS — BP 140/87 | Ht 62.0 in | Wt 220.5 lb

## 2020-12-24 DIAGNOSIS — Z3042 Encounter for surveillance of injectable contraceptive: Secondary | ICD-10-CM

## 2020-12-24 DIAGNOSIS — Z3009 Encounter for other general counseling and advice on contraception: Secondary | ICD-10-CM

## 2020-12-24 MED ORDER — MEDROXYPROGESTERONE ACETATE 150 MG/ML IM SUSP
150.0000 mg | Freq: Once | INTRAMUSCULAR | Status: AC
  Administered 2020-12-24: 150 mg via INTRAMUSCULAR

## 2020-12-24 NOTE — Progress Notes (Signed)
11 weeks 2 days post depo. Reports she takes her BP regularly at home and keeps a log of BP readings and it has been <140/90. BP today 140/87 and pt reports being under great stress today.  Local provider resource list given per pt request as she is looking for new PCP. Depo given today per order by Hazle Coca, CNM dated 05/01/2020. Depo given L UOQ and tolerated well. Next depo due 03/11/2021, pt aware. Jerel Shepherd, RN

## 2021-03-11 ENCOUNTER — Other Ambulatory Visit: Payer: Self-pay

## 2021-03-11 ENCOUNTER — Ambulatory Visit (LOCAL_COMMUNITY_HEALTH_CENTER): Payer: Medicaid Other | Admitting: Nurse Practitioner

## 2021-03-11 VITALS — BP 138/98 | Ht 62.0 in | Wt 223.2 lb

## 2021-03-11 DIAGNOSIS — Z3009 Encounter for other general counseling and advice on contraception: Secondary | ICD-10-CM

## 2021-03-11 DIAGNOSIS — Z3042 Encounter for surveillance of injectable contraceptive: Secondary | ICD-10-CM | POA: Diagnosis not present

## 2021-03-11 MED ORDER — MEDROXYPROGESTERONE ACETATE 150 MG/ML IM SUSP
150.0000 mg | Freq: Once | INTRAMUSCULAR | Status: AC
  Administered 2021-03-11: 150 mg via INTRAMUSCULAR

## 2021-03-11 NOTE — Progress Notes (Signed)
40 year old female seen today for a depo.  Last depo was 12/24/2020 (11w).  No problems noted.  Depo given per 05/01/20 order Arnetha Courser, CNM.  Next depo and physical due 05/27/21.  BP elevated during visit consulted with provider Wendi Snipes, FNP, ok to give depo today.   Glenna Fellows, RN

## 2021-05-28 ENCOUNTER — Other Ambulatory Visit: Payer: Self-pay

## 2021-05-28 ENCOUNTER — Ambulatory Visit (LOCAL_COMMUNITY_HEALTH_CENTER): Payer: Medicaid Other | Admitting: Advanced Practice Midwife

## 2021-05-28 VITALS — BP 130/77 | Ht 64.0 in | Wt 223.6 lb

## 2021-05-28 DIAGNOSIS — Z3009 Encounter for other general counseling and advice on contraception: Secondary | ICD-10-CM

## 2021-05-28 DIAGNOSIS — Z3042 Encounter for surveillance of injectable contraceptive: Secondary | ICD-10-CM | POA: Diagnosis not present

## 2021-05-28 MED ORDER — MEDROXYPROGESTERONE ACETATE 150 MG/ML IM SUSP
150.0000 mg | INTRAMUSCULAR | Status: AC
Start: 2021-05-28 — End: 2022-01-25
  Administered 2021-05-28 – 2022-01-25 (×4): 150 mg via INTRAMUSCULAR

## 2021-05-28 NOTE — Progress Notes (Signed)
[facility] DEPARTMENT [facility] [address] Main Number: [phone]    Family Planning Visit- Initial Visit  Subjective:  Samantha Baker is a 40 y.o. SBF smoker G1P1001 (45 yo son)  being seen today for an initial annual visit and to discuss contraceptive options.  The patient is currently using Depo Provera for pregnancy prevention. Patient reports she does not want a pregnancy in the next year.  Patient has the following medical conditions has Obesity 216 lbs; Elevated BP without diagnosis of hypertension 141/86, 130/89 on 05/01/20; and Smoker 1/2-1 ppd on their problem list.  Chief Complaint  Patient presents with   Contraception    Annual exam    Patient reports here for physical and DMPA. Last DMPA 03/11/21. Last sex 05/23/21 with condom; with current partner x 12 years; 1 partner in last 3 mo. Last dentist 2020. Last pap 05/01/20 neg HPV neg. Smoking 1/2-1 ppd. Last cigar age 53. Last ETOH 04/12/21 (2 Margaritas) 1x/mo. Not working, nor in school. Samantha Baker with son, 45 yo nephew, partner. Happy with DMPA and wants more. 130/77 today. 138/98 on 03/11/21. 141/86, 130/89 on 05/01/20. States she is now on blood pressure meds but can't remember name of it.  Patient denies vaping, MJ  Body mass index is 38.38 kg/m. - Patient is eligible for diabetes screening based on BMI and age >52?  not applicable HA1C ordered? not applicable  Patient reports 1  partner/s in last year. Desires STI screening?  Pt states no need  Has patient been screened once for HCV in the past?  No  No results found for: HCVAB  Does the patient have current drug use (including MJ), have a partner with drug use, and/or has been incarcerated since last result? No  If yes-- Screen for HCV through [facility]   Does the patient meet criteria for HBV testing? No  Criteria:  -Household, sexual or needle sharing contact with HBV -History of drug use -HIV positive -Those with known  Hep C   Health Maintenance Due  Topic Date Due   COVID-19 Vaccine (1) Never done   Pneumococcal Vaccine 17-43 Years old (1 - PCV) Never done   HIV Screening  Never done   Hepatitis C Screening  Never done   TETANUS/TDAP  Never done   INFLUENZA VACCINE  05/10/2021    Review of Systems  All other systems reviewed and are negative.  The following portions of the patient's history were reviewed and updated as appropriate: allergies, current medications, past family history, past medical history, past social history, past surgical history and problem list. Problem list updated.   See flowsheet for other program required questions.  Objective:   Vitals:   05/28/21 1444  BP: 130/77  Weight: 223 lb 9.6 oz (101.4 kg)  Height: 5\' 4"  (1.626 m)    Physical Exam Constitutional:      Appearance: Normal appearance. She is obese.  HENT:     Head: Normocephalic and atraumatic.     Mouth/Throat:     Mouth: Mucous membranes are moist.  Eyes:     Conjunctiva/sclera: Conjunctivae normal.  Cardiovascular:     Rate and Rhythm: Normal rate and regular rhythm.  Pulmonary:     Effort: Pulmonary effort is normal.     Breath sounds: Normal breath sounds.  Chest:  Breasts:    Right: Normal.     Left: Normal.  Abdominal:     Palpations: Abdomen is soft.  Comments: Poor tone, soft without masses or tenderness  Genitourinary:    Rectum: Normal.     Comments: Pt declines pelvic, cultures, denies sxs vaginitis Musculoskeletal:        General: Normal range of motion.     Cervical back: Normal range of motion and neck supple.  Lymphadenopathy:     Cervical:     Right cervical: No superficial, deep or posterior cervical adenopathy.    Left cervical: No superficial, deep or posterior cervical adenopathy.  Skin:    General: Skin is warm and dry.  Neurological:     Mental Status: She is alert.  Psychiatric:        Mood and Affect: Mood normal.      Assessment and Plan:  MACKENNA KAMER  is a 40 y.o. female presenting to the [facility] Department for an initial annual wellness/contraceptive visit  Contraception counseling: Reviewed all forms of birth control options in the tiered based approach. available including abstinence; over the counter/barrier methods; hormonal contraceptive medication including pill, patch, ring, injection,contraceptive implant, ECP; hormonal and nonhormonal IUDs; permanent sterilization options including vasectomy and the various tubal sterilization modalities. Risks, benefits, and typical effectiveness rates were reviewed.  Questions were answered.  Written information was also given to the patient to review.  Patient desires continuation of DMPA, this was prescribed for patient. She will follow up in  11-13 wks for surveillance.  She was told to call with any further questions, or with any concerns about this method of contraception.  Emphasized use of condoms 100% of the time for STI prevention.  Patient was offered ECP. ECP was not accepted by the patient. ECP counseling was not given - see RN documentation  1. Family planning  - medroxyPROGESTERone (DEPO-PROVERA) injection 150 mg  2. Encounter for surveillance of injectable contraceptive May have DMPA 150 mg IM q 11-13 wks x 1 year      No follow-ups on file.  No future appointments.  Alberteen Spindle, CNM

## 2021-05-28 NOTE — Progress Notes (Signed)
Patent here for a PE. Depo shot given RUOQ. Providers orders completed. Reminder card for next depo shot given.

## 2021-08-13 ENCOUNTER — Ambulatory Visit (LOCAL_COMMUNITY_HEALTH_CENTER): Payer: Medicaid Other

## 2021-08-13 ENCOUNTER — Other Ambulatory Visit: Payer: Self-pay

## 2021-08-13 VITALS — BP 128/94 | Ht 64.0 in | Wt 220.5 lb

## 2021-08-13 DIAGNOSIS — Z3009 Encounter for other general counseling and advice on contraception: Secondary | ICD-10-CM

## 2021-08-13 DIAGNOSIS — Z3042 Encounter for surveillance of injectable contraceptive: Secondary | ICD-10-CM | POA: Diagnosis not present

## 2021-08-13 NOTE — Progress Notes (Signed)
11 weeks 0 days post depo. BP elevated today at 128/94. Admits to drinking coffee and smoking before appt. Pt states BP is high when she goes to the doctor.  Explains that Phineas Real is her PCP but has not been lately. Consult Elveria Rising, FNP who gives ok for depo today based on order by Hazle Coca, CNM dated 05/28/2021. Provider advises RN to counsel pt about abstaining from caffeine and smoking before appt and to f-u with PCP regarding BP. RN carried out provider orders. RN provided copy of BP readings last 2 visits for pt to share with PCP. Questions answered and pt states understanding. Pt plans to f-u with PCP. Depo given today and tolerated well LUOQ. Next depo due 10/29/2020, pt aware. Jerel Shepherd, RN

## 2021-08-16 NOTE — Progress Notes (Signed)
Consulted by RN re: patient situation.  Reviewed RN note and agree that it reflects our discussion and my recommendations.  ° ° °Abrie Egloff, FNP  °

## 2021-08-24 ENCOUNTER — Ambulatory Visit: Payer: Medicaid Other

## 2021-11-09 ENCOUNTER — Other Ambulatory Visit: Payer: Self-pay

## 2021-11-09 ENCOUNTER — Ambulatory Visit (LOCAL_COMMUNITY_HEALTH_CENTER): Payer: Medicaid Other

## 2021-11-09 VITALS — BP 135/90 | Ht 64.0 in | Wt 221.5 lb

## 2021-11-09 DIAGNOSIS — Z3009 Encounter for other general counseling and advice on contraception: Secondary | ICD-10-CM | POA: Diagnosis not present

## 2021-11-09 DIAGNOSIS — Z3042 Encounter for surveillance of injectable contraceptive: Secondary | ICD-10-CM

## 2021-11-09 NOTE — Progress Notes (Signed)
12 weeks 4 day post depo. Voices no concerns. States she did not see her PCP as recommended at last visit. Today's BP 135/90. Admits to smoking before appt. RN advised against smoking and caffeine before appts. Pt reports plans to establish PCP. Depo administered today per order by Ola Spurr, CNM dated 05/28/2021. Tolerated well RUOQ. Next depo due 01/25/2022, pt aware. Josie Saunders, RN

## 2022-01-11 ENCOUNTER — Ambulatory Visit: Payer: Medicaid Other | Admitting: Podiatry

## 2022-01-11 DIAGNOSIS — L600 Ingrowing nail: Secondary | ICD-10-CM

## 2022-01-12 ENCOUNTER — Telehealth: Payer: Self-pay | Admitting: *Deleted

## 2022-01-12 ENCOUNTER — Telehealth: Payer: Self-pay | Admitting: Podiatry

## 2022-01-12 NOTE — Telephone Encounter (Signed)
"  I am calling because I'm trying to find out if It's okay if I'm able to soak my toe today from having my toenail removal yesterday.  I can't seem to get all the bandage off to change it.  Please give me a call back." ? ?I called and informed her that she does need to start soaking it today.  She asked how many times a day and I informed her twice a day.  I also advised her to initially soak the toe with the bandage intact for about five minutes to help loosen it up prior to removing the bandage. ?

## 2022-01-12 NOTE — Telephone Encounter (Signed)
Patient saw Dr Allena Katz yesterday and had her toe nail removed. She has been soaking her foot all day today and is unable to get all the guaze off of the nail bed. She states that when she tries to pull it off, it is very painful. She wants to know what else can she do to remove it. ? ?Please advise. ?

## 2022-01-12 NOTE — Telephone Encounter (Signed)
"  I'm calling back.  I spoke with someone earlier.  They told me to soak my toe to get the bandage off.  I soaked and soaked, the bandage is still not moving in one spot and it's very painful.  So, I'm trying to find out if I need to just let this bandage dry and try to put Neosporin around it until it's able to come off.  What else do I need to do?" ?

## 2022-01-12 NOTE — Telephone Encounter (Signed)
I spoke to patient and she states she is using Vaseline on it and will continue to use that until she is able to remove all the gauze from her toe. She did not want to come in to see the nurse at this time.  ?

## 2022-01-18 ENCOUNTER — Encounter: Payer: Self-pay | Admitting: Podiatry

## 2022-01-18 NOTE — Progress Notes (Signed)
?  Subjective:  ?Patient ID: Samantha Baker, female    DOB: [birth year],  MRN: [id number] ? ?Chief Complaint  ?Patient presents with  ? Nail Problem  ? ? ?41 y.o. female presents with the above complaint.  Patient presents with thickened elongated dystrophic toenail to the left hallux.  She states painful to touch.  She would like to have it removed.  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  It hurts with pressure where it hurts with ambulation hurts was done with some type of shoes.  She denies seeing anyone else prior to seeing me.  She is taking antibiotics. ? ? ?Review of Systems: Negative except as noted in the HPI. Denies N/V/F/Ch. ? ?Past Medical History:  ?Diagnosis Date  ? Patient denies medical problems   ? ? ?Current Outpatient Medications:  ?  Multiple Vitamin (MULTIVITAMIN) capsule, Take 1 capsule by mouth daily., Disp: 100 capsule, Rfl: 0 ?  mupirocin ointment (BACTROBAN) 2 %, Apply 1 application topically 2 (two) times daily. (Patient not taking: No sig reported), Disp: 30 g, Rfl: 0 ?  sulfamethoxazole-trimethoprim (BACTRIM DS,SEPTRA DS) 800-160 MG tablet, Take 1 tablet by mouth 2 (two) times daily. (Patient not taking: No sig reported), Disp: 20 tablet, Rfl: 0 ? ?Current Facility-Administered Medications:  ?  medroxyPROGESTERone (DEPO-PROVERA) injection 150 mg, 150 mg, Intramuscular, Q90 days, Sciora, Elizabeth A, CNM, 150 mg at 11/09/21 1558 ? ?Social History  ? ?Tobacco Use  ?Smoking Status Every Day  ? Packs/day: 0.50  ? Types: Cigarettes  ?Smokeless Tobacco Never  ? ? ?Allergies  ?Allergen Reactions  ? Penicillins Nausea And Vomiting  ? ?Objective:  ?There were no vitals filed for this visit. ?There is no height or weight on file to calculate BMI. ?Constitutional Well developed. ?Well nourished.  ?Vascular Dorsalis pedis pulses palpable bilaterally. ?Posterior tibial pulses palpable bilaterally. ?Capillary refill normal to all digits.  ?No cyanosis or clubbing noted. ?Pedal hair growth normal.  ?Neurologic Normal  speech. ?Oriented to person, place, and time. ?Epicritic sensation to light touch grossly present bilaterally.  ?Dermatologic Pain on palpation of the entire/total nail on 1st digit of the left ?No other open wounds. ?No skin lesions.  ?Orthopedic: Normal joint ROM without pain or crepitus bilaterally. ?No visible deformities. ?No bony tenderness.  ? ?Radiographs: None ?Assessment:  ? ?1. Ingrown left big toenail   ? ?Plan:  ?Patient was evaluated and treated and all questions answered. ? ?Nail contusion/dystrophy hallux, left with underlying ingrown ?-Patient elects to proceed with minor surgery to remove entire toenail today. Consent reviewed and signed by patient. ?-Entire/total nail excised. See procedure note. ?-Educated on post-procedure care including soaking. Written instructions provided and reviewed. ?-Patient to follow up in 2 weeks for nail check. ? ?Procedure: Excision of entire/total nail  ?Location: Left 1st toe digit ?Anesthesia: Lidocaine 1% plain; 1.5 mL and Marcaine 0.5% plain; 1.5 mL, digital block. ?Skin Prep: Betadine. ?Dressing: Silvadene; telfa; dry, sterile, compression dressing. ?Technique: Following skin prep, the toe was exsanguinated and a tourniquet was secured at the base of the toe. The affected nail border was freed and excised. The tourniquet was then removed and sterile dressing applied. ?Disposition: Patient tolerated procedure well. Patient to return in 2 weeks for follow-up.  ? ?No follow-ups on file. ? ?

## 2022-01-25 ENCOUNTER — Ambulatory Visit (LOCAL_COMMUNITY_HEALTH_CENTER): Payer: Medicaid Other

## 2022-01-25 VITALS — BP 136/91 | Ht 64.0 in | Wt 224.0 lb

## 2022-01-25 DIAGNOSIS — Z309 Encounter for contraceptive management, unspecified: Secondary | ICD-10-CM | POA: Diagnosis not present

## 2022-01-25 DIAGNOSIS — Z3009 Encounter for other general counseling and advice on contraception: Secondary | ICD-10-CM

## 2022-01-25 DIAGNOSIS — Z3042 Encounter for surveillance of injectable contraceptive: Secondary | ICD-10-CM | POA: Diagnosis not present

## 2022-01-25 NOTE — Progress Notes (Signed)
Consulted by RN re: patient situation.  Reviewed RN note and agree that it reflects our discussion and my recommendations.  ° ° °Dylan Ruotolo, FNP  °

## 2022-01-25 NOTE — Progress Notes (Signed)
11 weeks 0 days post depo. BP elevated at 136/91. Pt explains BP is high every time she is at the doctor's office and is not high when she takes BP at home." Admits to drinking coffee and smoking cigarette before appt today. RN counseled pt to abstain from caffeine and smoking before appt and to keep diary of BP readings and to contact PCP if readings are above 140/90. Pt verbalizes understanding.  ? ?Consult Elveria Rising, FNP who gives ok for depo today from order by Hazle Coca, CNM dated 05/28/2021. Depo administered and tolerated well LUOQ. Next depo due 04/12/2022, has reminder. Jerel Shepherd, RN ? ?

## 2022-02-11 ENCOUNTER — Telehealth: Payer: Self-pay | Admitting: *Deleted

## 2022-02-11 NOTE — Telephone Encounter (Signed)
"  I was calling because I was wondering when will it be safe for me to polish over my toe.  I got my big toe removed, sometime last month.  I was wondering if it's safe to go ahead and polish over it." ?

## 2022-02-16 NOTE — Telephone Encounter (Signed)
I'm returning your call.  Dr. Allena Katz said, "If your skin has healed.  It should be safe to polish over it."   ?

## 2022-04-14 ENCOUNTER — Ambulatory Visit (LOCAL_COMMUNITY_HEALTH_CENTER): Payer: Medicaid Other

## 2022-04-14 VITALS — BP 128/82 | Ht 64.0 in | Wt 223.0 lb

## 2022-04-14 DIAGNOSIS — Z3042 Encounter for surveillance of injectable contraceptive: Secondary | ICD-10-CM | POA: Diagnosis not present

## 2022-04-14 DIAGNOSIS — Z309 Encounter for contraceptive management, unspecified: Secondary | ICD-10-CM

## 2022-04-14 DIAGNOSIS — Z3009 Encounter for other general counseling and advice on contraception: Secondary | ICD-10-CM

## 2022-04-14 MED ORDER — MEDROXYPROGESTERONE ACETATE 150 MG/ML IM SUSP
150.0000 mg | Freq: Once | INTRAMUSCULAR | Status: AC
  Administered 2022-04-14: 150 mg via INTRAMUSCULAR

## 2022-04-14 NOTE — Progress Notes (Signed)
11 weeks post depo.  Desires to continue depo.  Depo given IM RUOQ per order by E. Sciora CNM dated 05/28/21; tolerated well.  Pt states she started medication for her BP this week ([facility]) Spironolactone 25 mg a day.  Depo and PE due 06/30/22; appt reminder given.  Cherlynn Polo, RN

## 2022-05-05 ENCOUNTER — Telehealth: Payer: Self-pay | Admitting: *Deleted

## 2022-05-05 NOTE — Telephone Encounter (Signed)
Patient is calling to ask if she can apply an acrylic nail over the procedural nail,is now healing well and nail is growing back some. Please advise.

## 2022-05-06 NOTE — Telephone Encounter (Signed)
Patient has been given go ahead on acrylic nails.

## 2022-06-14 ENCOUNTER — Emergency Department
Admission: EM | Admit: 2022-06-14 | Discharge: 2022-06-14 | Disposition: A | Discharge status: Home / Self Care | Payer: Medicaid Other | Attending: Emergency Medicine | Admitting: Emergency Medicine

## 2022-06-14 ENCOUNTER — Other Ambulatory Visit: Payer: Self-pay

## 2022-06-14 DIAGNOSIS — R0981 Nasal congestion: Secondary | ICD-10-CM | POA: Diagnosis present

## 2022-06-14 DIAGNOSIS — U071 COVID-19: Secondary | ICD-10-CM | POA: Insufficient documentation

## 2022-06-14 LAB — SARS CORONAVIRUS 2 BY RT PCR: SARS Coronavirus 2 by RT PCR: POSITIVE — AB

## 2022-06-14 MED ORDER — ALBUTEROL SULFATE HFA 108 (90 BASE) MCG/ACT IN AERS: 2.0000 | INHALATION_SPRAY | Freq: Four times a day (QID) | RESPIRATORY_TRACT | 2 refills | Status: AC | PRN

## 2022-06-14 NOTE — ED Provider Notes (Signed)
[facility] Provider Note  Patient Contact: 11:08 PM (approximate)   History   Headache and Nasal Congestion   HPI  Samantha Baker is a 41 y.o. female with an unremarkable past medical history, presents to the emergency department with headache, rhinorrhea, nasal congestion and low-grade fever for the past 2 days.  No chest pain, chest tightness or abdominal pain.      Physical Exam   Triage Vital Signs: ED Triage Vitals  Enc Vitals Group     BP 06/14/22 1922 (!) 139/96     Pulse Rate 06/14/22 1922 (!) 117     Resp 06/14/22 1922 20     Temp 06/14/22 1922 100 F (37.8 C)     Temp Source 06/14/22 1922 Oral     SpO2 06/14/22 1922 97 %     Weight 06/14/22 1919 221 lb (100.2 kg)     Height 06/14/22 1919 5\' 3"  (1.6 m)     Head Circumference --      Peak Flow --      Pain Score 06/14/22 1919 6     Pain Loc --      Pain Edu? --      Excl. in GC? --     Most recent vital signs: Vitals:   06/14/22 1922  BP: (!) 139/96  Pulse: (!) 117  Resp: 20  Temp: 100 F (37.8 C)  SpO2: 97%     Constitutional: Alert and oriented. Patient is lying supine. Eyes: Conjunctivae are normal. PERRL. EOMI. Head: Atraumatic. ENT:      Ears: Tympanic membranes are mildly injected with mild effusion bilaterally.       Nose: No congestion/rhinnorhea.      Mouth/Throat: Mucous membranes are moist. Posterior pharynx is mildly erythematous.  Hematological/Lymphatic/Immunilogical: No cervical lymphadenopathy.  Cardiovascular: Normal rate, regular rhythm. Normal S1 and S2.  Good peripheral circulation. Respiratory: Normal respiratory effort without tachypnea or retractions. Lungs CTAB. Good air entry to the bases with no decreased or absent breath sounds. Gastrointestinal: Bowel sounds 4 quadrants. Soft and nontender to palpation. No guarding or rigidity. No palpable masses. No distention. No CVA tenderness. Musculoskeletal: Full range of motion to all extremities. No  gross deformities appreciated. Neurologic:  Normal speech and language. No gross focal neurologic deficits are appreciated.  Skin:  Skin is warm, dry and intact. No rash noted. Psychiatric: Mood and affect are normal. Speech and behavior are normal. Patient exhibits appropriate insight and judgement.   ED Results / Procedures / Treatments   Labs (all labs ordered are listed, but only abnormal results are displayed) Labs Reviewed  SARS CORONAVIRUS 2 BY RT PCR - Abnormal; Notable for the following components:      Result Value   SARS Coronavirus 2 by RT PCR POSITIVE (*)    All other components within normal limits       PROCEDURES:  Critical Care performed: No  Procedures   MEDICATIONS ORDERED IN ED: Medications - No data to display   IMPRESSION / MDM / ASSESSMENT AND PLAN / ED COURSE  I reviewed the triage vital signs and the nursing notes.                              Assessment and plan COVID-19 Patient was mildly tachycardic at triage but vital signs were otherwise reassuring.  On exam, patient was alert and nontoxic-appearing  Patient tested positive for COVID-19.  Tylenol and  ibuprofen alternating were recommended for discomfort and patient was prescribed albuterol inhaler.  All patient questions were answered.   FINAL CLINICAL IMPRESSION(S) / ED DIAGNOSES   Final diagnoses:  COVID-19     Rx / DC Orders   ED Discharge Orders     None        Note:  This document was prepared using Dragon voice recognition software and may include unintentional dictation errors.   Pia Mau Dudley, PA-C 06/14/22 Janetta Hora    Shaune Pollack, MD 06/14/22 [phone]

## 2022-06-14 NOTE — ED Triage Notes (Signed)
Pt presents c/o headache x2 days. Reports head congestion and chills. Ambulatory to triage.

## 2022-07-11 ENCOUNTER — Ambulatory Visit: Payer: Medicaid Other

## 2022-07-11 ENCOUNTER — Ambulatory Visit (LOCAL_COMMUNITY_HEALTH_CENTER): Payer: Medicaid Other | Admitting: Nurse Practitioner

## 2022-07-11 VITALS — BP 142/97 | HR 106 | Wt 214.6 lb

## 2022-07-11 DIAGNOSIS — Z3009 Encounter for other general counseling and advice on contraception: Secondary | ICD-10-CM

## 2022-07-11 DIAGNOSIS — Z309 Encounter for contraceptive management, unspecified: Secondary | ICD-10-CM | POA: Diagnosis not present

## 2022-07-11 DIAGNOSIS — Z30013 Encounter for initial prescription of injectable contraceptive: Secondary | ICD-10-CM | POA: Diagnosis not present

## 2022-07-11 DIAGNOSIS — Z3042 Encounter for surveillance of injectable contraceptive: Secondary | ICD-10-CM

## 2022-07-11 DIAGNOSIS — Z0000 Encounter for general adult medical examination without abnormal findings: Secondary | ICD-10-CM

## 2022-07-11 MED ORDER — MEDROXYPROGESTERONE ACETATE 150 MG/ML IM SUSP
150.0000 mg | INTRAMUSCULAR | Status: AC
  Administered 2022-07-11 – 2023-05-31 (×5): 150 mg via INTRAMUSCULAR

## 2022-07-11 NOTE — Progress Notes (Signed)
Pt appointment for PE and Depo. Family Planning packet provided and reviewed. Seen by FNP White. RN administered Depo. Pt tolerated well.

## 2022-07-12 NOTE — Progress Notes (Signed)
[name] Number: [phone]    Family Planning Visit- Initial Visit  Subjective:  Samantha Baker is a 41 y.o.  G1P1001   being seen today for an initial annual visit and to discuss reproductive life planning.  The patient is currently using Hormonal Injection for pregnancy prevention. Patient reports   does not want a pregnancy in the next year.     report they are looking for a method that provides High efficacy at preventing pregnancy  Patient has the following medical conditions has Obesity 216 lbs; Elevated BP without diagnosis of hypertension 141/86, 130/89 on 05/01/20; and Smoker 1/2-1 ppd on their problem list.  No chief complaint on file.   Patient reports to clinic today for a physical and continuation of birth control.     Body mass index is 38.01 kg/m. - Patient is eligible for diabetes screening based on BMI and age >35?  yes HA1C ordered? No, patient refused   Patient reports 1  partner/s in last year. Desires STI screening?  No - refused  Has patient been screened once for HCV in the past?  No  No results found for: "HCVAB"  Does the patient have current drug use (including MJ), have a partner with drug use, and/or has been incarcerated since last result? No  If yes-- Screen for HCV through [facility]   Does the patient meet criteria for HBV testing? No  Criteria:  -Household, sexual or needle sharing contact with HBV -History of drug use -HIV positive -Those with known Hep C   Health Maintenance Due  Topic Date Due   COVID-19 Vaccine (1) Never done   HIV Screening  Never done   Hepatitis C Screening  Never done   INFLUENZA VACCINE  Never done    Review of Systems  Constitutional:  Negative for chills, fever, malaise/fatigue and weight loss.  HENT:  Negative for congestion, hearing loss and sore throat.   Eyes:  Negative for blurred vision, double vision and  photophobia.  Respiratory:  Negative for shortness of breath.   Cardiovascular:  Negative for chest pain.  Gastrointestinal:  Negative for abdominal pain, blood in stool, constipation, diarrhea, heartburn, nausea and vomiting.  Genitourinary:  Negative for dysuria and frequency.  Musculoskeletal:  Negative for back pain, joint pain and neck pain.  Skin:  Negative for itching and rash.  Neurological:  Negative for dizziness, weakness and headaches.  Endo/Heme/Allergies:  Does not bruise/bleed easily.  Psychiatric/Behavioral:  Negative for depression, substance abuse and suicidal ideas.     The following portions of the patient's history were reviewed and updated as appropriate: allergies, current medications, past family history, past medical history, past social history, past surgical history and problem list. Problem list updated.   See flowsheet for other program required questions.  Objective:   Vitals:   07/11/22 1545 07/11/22 1653  BP: (!) 146/96 (!) 142/97  Pulse: (!) 116 (!) 106  Weight: 214 lb 9.6 oz (97.3 kg)     Physical Exam Constitutional:      Appearance: Normal appearance.  HENT:     Head: Normocephalic. No abrasion, masses or laceration. Hair is normal.     Jaw: No tenderness or swelling.     Right Ear: External ear normal.     Left Ear: External ear normal.     Nose: Nose normal.     Mouth/Throat:     Lips: Pink. No lesions.  Mouth: Mucous membranes are moist. No lacerations or oral lesions.     Dentition: No dental caries.     Tongue: No lesions.     Palate: No mass and lesions.     Pharynx: No pharyngeal swelling, oropharyngeal exudate, posterior oropharyngeal erythema or uvula swelling.     Tonsils: No tonsillar exudate or tonsillar abscesses.  Eyes:     Pupils: Pupils are equal, round, and reactive to light.  Neck:     Thyroid: No thyroid mass, thyromegaly or thyroid tenderness.  Cardiovascular:     Rate and Rhythm: Normal rate and regular  rhythm.  Pulmonary:     Effort: Pulmonary effort is normal.     Breath sounds: Normal breath sounds.  Chest:  Breasts:    Right: Normal. No swelling, mass, nipple discharge, skin change or tenderness.     Left: Normal. No swelling, mass, nipple discharge, skin change or tenderness.  Abdominal:     General: Abdomen is flat. Bowel sounds are normal.     Palpations: Abdomen is soft.     Tenderness: There is no abdominal tenderness. There is no rebound.  Genitourinary:    Comments: Deferred, declined genital exam today Musculoskeletal:     Cervical back: Full passive range of motion without pain and normal range of motion.  Lymphadenopathy:     Cervical: No cervical adenopathy.     Right cervical: No superficial, deep or posterior cervical adenopathy.    Left cervical: No superficial, deep or posterior cervical adenopathy.     Upper Body:     Right upper body: No supraclavicular, axillary or epitrochlear adenopathy.     Left upper body: No supraclavicular, axillary or epitrochlear adenopathy.  Skin:    General: Skin is warm and dry.     Findings: No erythema, laceration, lesion or rash.  Neurological:     Mental Status: She is alert and oriented to person, place, and time.  Psychiatric:        Attention and Perception: Attention normal.        Mood and Affect: Mood normal.        Speech: Speech normal.        Behavior: Behavior normal. Behavior is cooperative.       Assessment and Plan:  Samantha Baker is a 41 y.o. female presenting to the [facility] Department for an initial annual wellness/contraceptive visit  Contraception counseling: Reviewed options based on patient desire and reproductive life plan. Patient is interested in Hormonal Injection. This was provided to the patient today.   Risks, benefits, and typical effectiveness rates were reviewed.  Questions were answered.  Written information was also given to the patient to review.    The patient will follow  up in  11 weeks for surveillance and Depo.  The patient was told to call with any further questions, or with any concerns about this method of contraception.  Emphasized use of condoms 100% of the time for STI prevention.  Need for ECP was assessed. ECP not offered due to continued use of a birth control method.     1. Family planning counseling -41 year old female in clinic today for a physical and continuation of Depo.  -ROS reviewed, no complaints. -STD screening offered, patient declines.  -Patient desires to continue with Depo.   2. Well woman exam (no gynecological exam) -Normal well woman exam.  -CBE today, next due 07/2023 -PAP due 04/2025  3. Surveillance for Depo-Provera contraception -May have Depo 150  MG IM q 11-13 weeks x 1 year.   - medroxyPROGESTERone (DEPO-PROVERA) injection 150 mg   Total time spent: 30 minutes   Return in about 11 weeks (around 09/26/2022) for Routine DMPA injection.   Glenna Fellows, FNP

## 2022-09-29 ENCOUNTER — Ambulatory Visit (LOCAL_COMMUNITY_HEALTH_CENTER): Payer: Medicaid Other

## 2022-09-29 VITALS — BP 129/76 | Ht 64.0 in | Wt 213.0 lb

## 2022-09-29 DIAGNOSIS — Z309 Encounter for contraceptive management, unspecified: Secondary | ICD-10-CM

## 2022-09-29 DIAGNOSIS — Z3042 Encounter for surveillance of injectable contraceptive: Secondary | ICD-10-CM

## 2022-09-29 DIAGNOSIS — Z3009 Encounter for other general counseling and advice on contraception: Secondary | ICD-10-CM

## 2022-09-29 NOTE — Progress Notes (Signed)
11 weeks 3 days post depo.  Depo given today per order by Onalee Hua, FNP dated 07/11/2022. Tolerated well RUOQ. Next depo due 12/15/2022, has reminder. Jerel Shepherd, RN

## 2022-12-15 ENCOUNTER — Ambulatory Visit: Payer: Medicaid Other

## 2022-12-15 ENCOUNTER — Ambulatory Visit (LOCAL_COMMUNITY_HEALTH_CENTER): Payer: Medicaid Other

## 2022-12-15 VITALS — BP 135/83 | Ht 64.0 in | Wt 214.0 lb

## 2022-12-15 DIAGNOSIS — Z3042 Encounter for surveillance of injectable contraceptive: Secondary | ICD-10-CM

## 2022-12-15 DIAGNOSIS — Z309 Encounter for contraceptive management, unspecified: Secondary | ICD-10-CM | POA: Diagnosis not present

## 2022-12-15 DIAGNOSIS — Z3009 Encounter for other general counseling and advice on contraception: Secondary | ICD-10-CM

## 2022-12-15 DIAGNOSIS — Z30013 Encounter for initial prescription of injectable contraceptive: Secondary | ICD-10-CM

## 2022-12-15 NOTE — Progress Notes (Signed)
11 weeks post Depo. Medroxyprogesterone Acetate 150 mg.. given as ordered by Gregary Cromer, FNP on 07/11/2022 for 1 year. Given IM LUOQ. Tolerated well. Reminder card provided to call for next appointment due 03/03/2023.

## 2023-01-23 ENCOUNTER — Emergency Department
Admission: EM | Admit: 2023-01-23 | Discharge: 2023-01-23 | Disposition: A | Discharge status: Home / Self Care | Payer: Medicaid Other | Attending: Emergency Medicine | Admitting: Emergency Medicine

## 2023-01-23 ENCOUNTER — Emergency Department: Payer: Medicaid Other

## 2023-01-23 ENCOUNTER — Other Ambulatory Visit: Payer: Self-pay

## 2023-01-23 ENCOUNTER — Encounter: Payer: Self-pay | Admitting: *Deleted

## 2023-01-23 DIAGNOSIS — R202 Paresthesia of skin: Secondary | ICD-10-CM | POA: Diagnosis present

## 2023-01-23 DIAGNOSIS — G5621 Lesion of ulnar nerve, right upper limb: Secondary | ICD-10-CM | POA: Insufficient documentation

## 2023-01-23 MED ORDER — MELOXICAM 15 MG PO TABS: 15.0000 mg | ORAL_TABLET | Freq: Every day | ORAL | 1 refills | Status: AC

## 2023-01-23 NOTE — Discharge Instructions (Addendum)
Take Meloxicam once daily for pain and inflammation.  

## 2023-01-23 NOTE — ED Triage Notes (Signed)
Pt has right hand pain.  Pt was seen at urgent care today .  Pt sent to er for eval.  Pt was moving boxes yesterday and injured hand.  Pt alert.

## 2023-01-23 NOTE — ED Provider Notes (Signed)
[facility] Provider Note  Patient Contact: 7:03 PM (approximate)   History   Hand Pain   HPI  Samantha Baker is a 42 y.o. female presents to the emergency department with right fourth and fifth digit numbness and tingling after multiple hours of flexion and extension at the right elbow while working at home.  No prior history of cubital tunnel syndrome.  No falls or other mechanisms of trauma.      Physical Exam   Triage Vital Signs: ED Triage Vitals  Enc Vitals Group     BP 01/23/23 1742 (!) 144/92     Pulse Rate 01/23/23 1742 91     Resp 01/23/23 1742 20     Temp 01/23/23 1742 97.8 F (36.6 C)     Temp Source 01/23/23 1742 Oral     SpO2 01/23/23 1742 99 %     Weight 01/23/23 1743 213 lb (96.6 kg)     Height 01/23/23 1743 5\' 4"  (1.626 m)     Head Circumference --      Peak Flow --      Pain Score 01/23/23 1743 10     Pain Loc --      Pain Edu? --      Excl. in GC? --     Most recent vital signs: Vitals:   01/23/23 1742  BP: (!) 144/92  Pulse: 91  Resp: 20  Temp: 97.8 F (36.6 C)  SpO2: 99%     General: Alert and in no acute distress. Eyes:  PERRL. EOMI. Head: No acute traumatic findings ENT:      Nose: No congestion/rhinnorhea.      Mouth/Throat: Mucous membranes are moist.  Neck: No stridor. No cervical spine tenderness to palpation. Cardiovascular:  Good peripheral perfusion Respiratory: Normal respiratory effort without tachypnea or retractions. Lungs CTAB. Good air entry to the bases with no decreased or absent breath sounds. Gastrointestinal: Bowel sounds 4 quadrants. Soft and nontender to palpation. No guarding or rigidity. No palpable masses. No distention. No CVA tenderness. Musculoskeletal: Full range of motion to all extremities.  Neurologic:  No gross focal neurologic deficits are appreciated.  Skin:   No rash noted    ED Results / Procedures / Treatments   Labs (all labs ordered are listed, but only abnormal  results are displayed) Labs Reviewed - No data to display      PROCEDURES:  Critical Care performed: No  Procedures   MEDICATIONS ORDERED IN ED: Medications - No data to display   IMPRESSION / MDM / ASSESSMENT AND PLAN / ED COURSE  I reviewed the triage vital signs and the nursing notes.                             Assessment and plan:  Cubital tunnel syndrome 42 year old female presents to the emergency department with numbness and tingling in the right hand.  Vital signs are reassuring at triage.  On exam, patient was alert, active and nontoxic-appearing.  History and physical exam findings suggest cubital tunnel syndrome.  I did recommend immobilization at the elbow with a towel at night before bed to avoid flexion while sleeping and daily meloxicam.  Recommended Ortho follow-up with Dr. Joice Lofts.     FINAL CLINICAL IMPRESSION(S) / ED DIAGNOSES   Final diagnoses:  Cubital tunnel syndrome on right     Rx / DC Orders   ED Discharge Orders  Ordered    meloxicam (MOBIC) 15 MG tablet  Daily        01/23/23 1855             Note:  This document was prepared using Dragon voice recognition software and may include unintentional dictation errors.   Pia Mau Buxton, PA-C 01/23/23 1905    Merwyn Katos, MD 01/24/23 [phone]

## 2023-03-03 ENCOUNTER — Ambulatory Visit (LOCAL_COMMUNITY_HEALTH_CENTER): Payer: Medicaid Other

## 2023-03-03 VITALS — BP 125/83 | Wt 213.0 lb

## 2023-03-03 DIAGNOSIS — Z3009 Encounter for other general counseling and advice on contraception: Secondary | ICD-10-CM

## 2023-03-03 DIAGNOSIS — Z309 Encounter for contraceptive management, unspecified: Secondary | ICD-10-CM

## 2023-03-03 DIAGNOSIS — Z3042 Encounter for surveillance of injectable contraceptive: Secondary | ICD-10-CM

## 2023-03-03 NOTE — Progress Notes (Signed)
11weeks 1 day post depo. Voices no concerns.Depo given today per order-A.White, FNP dated 07-11-22. Tolerated well to RUOQ. Next depo due 05/19/23.

## 2023-05-31 ENCOUNTER — Ambulatory Visit (LOCAL_COMMUNITY_HEALTH_CENTER): Payer: Medicaid Other

## 2023-05-31 ENCOUNTER — Ambulatory Visit: Payer: Medicaid Other

## 2023-05-31 VITALS — BP 124/86 | Ht 64.0 in | Wt 211.5 lb

## 2023-05-31 DIAGNOSIS — Z3009 Encounter for other general counseling and advice on contraception: Secondary | ICD-10-CM

## 2023-05-31 DIAGNOSIS — Z309 Encounter for contraceptive management, unspecified: Secondary | ICD-10-CM | POA: Diagnosis not present

## 2023-05-31 DIAGNOSIS — Z30013 Encounter for initial prescription of injectable contraceptive: Secondary | ICD-10-CM

## 2023-05-31 DIAGNOSIS — Z3042 Encounter for surveillance of injectable contraceptive: Secondary | ICD-10-CM

## 2023-05-31 NOTE — Progress Notes (Signed)
12 weeks 5 days post depo.  Per pt, recently had COVID, mild case, but stopped spirolactone and plans to restart it this week.  Depo given today per order by Onalee Hua, FNP dated 07/11/2022. Tolerated well LUOQ.   Plans to have PE when next depo is due, approx 08/16/2023. Has reminder. Jerel Shepherd, RN

## 2023-08-24 ENCOUNTER — Ambulatory Visit: Payer: Medicaid Other | Admitting: Advanced Practice Midwife

## 2023-08-24 ENCOUNTER — Encounter: Payer: Self-pay | Admitting: Family Medicine

## 2023-08-24 VITALS — BP 124/77 | HR 110 | Ht 64.0 in | Wt 211.0 lb

## 2023-08-24 DIAGNOSIS — Z720 Tobacco use: Secondary | ICD-10-CM

## 2023-08-24 DIAGNOSIS — Z309 Encounter for contraceptive management, unspecified: Secondary | ICD-10-CM | POA: Diagnosis not present

## 2023-08-24 DIAGNOSIS — Z30013 Encounter for initial prescription of injectable contraceptive: Secondary | ICD-10-CM | POA: Diagnosis not present

## 2023-08-24 DIAGNOSIS — Z3009 Encounter for other general counseling and advice on contraception: Secondary | ICD-10-CM

## 2023-08-24 DIAGNOSIS — Z3042 Encounter for surveillance of injectable contraceptive: Secondary | ICD-10-CM

## 2023-08-24 DIAGNOSIS — J45909 Unspecified asthma, uncomplicated: Secondary | ICD-10-CM

## 2023-08-24 MED ORDER — MEDROXYPROGESTERONE ACETATE 150 MG/ML IM SUSP
150.0000 mg | INTRAMUSCULAR | Status: AC
Start: 2023-08-24 — End: 2024-04-18
  Administered 2023-08-24 – 2024-04-18 (×4): 150 mg via INTRAMUSCULAR

## 2023-08-24 NOTE — Progress Notes (Signed)
[facility] [address] Main Number: [phone]   Family Planning Visit- Initial Visit  Subjective:  Samantha Baker is a 42 y.o. SBF smoker  G1P1001 (62 yo son)  being seen today for an initial annual visit and to discuss reproductive life planning.  The patient is currently using Hormonal Injection for pregnancy prevention. Patient reports   does not want a pregnancy in the next year.     report they are looking for a method that provides High efficacy at preventing pregnancy  Patient has the following medical conditions has Obesity 216 lbs BMI=36.2; Elevated BP without diagnosis of hypertension 141/86, 130/89 on 05/01/20; and Smoker 1/2-1 ppd on their problem list.  No chief complaint on file.   Patient reports here for physical and DMPA. Last DMPA 05/31/23. Last PE 07/11/22. Last pap 05/01/20 neg HPV neg. LMP none with DMPA. Last sex 06/2023 with condom; with current partner since 2011. Smoking 1/2-1 ppd. Now has new dx asthma after covid. Last dental exam 2021. Highest grade completed 14. Not working. Student on line (business).  Patient denies MJ  Body mass index is 36.22 kg/m. - Patient is eligible for diabetes screening based on BMI> 25 and age >35?  yes HA1C ordered? Pt declines  Patient reports 1  partner/s in last year. Desires STI screening?  No - declines  Has patient been screened once for HCV in the past?  Yes  No results found for: "HCVAB"  Does the patient have current drug use (including MJ), have a partner with drug use, and/or has been incarcerated since last result? No  If yes-- Screen for HCV through [facility]   Does the patient meet criteria for HBV testing? Yes  Criteria:  -Household, sexual or needle sharing contact with HBV -History of drug use -HIV positive -Those with known Hep C   Health Maintenance Due  Topic Date Due   HIV Screening  Never done   Hepatitis C Screening  Never done    INFLUENZA VACCINE  Never done   COVID-19 Vaccine (1 - 2023-24 season) Never done   DTaP/Tdap/Td (2 - Td or Tdap) 08/08/2023    Review of Systems  All other systems reviewed and are negative.   The following portions of the patient's history were reviewed and updated as appropriate: allergies, current medications, past family history, past medical history, past social history, past surgical history and problem list. Problem list updated.   See flowsheet for other program required questions.  Objective:   Vitals:   08/24/23 1539  BP: 124/77  Pulse: (!) 110  Weight: 211 lb (95.7 kg)  Height: 5\' 4"  (1.626 m)    Physical Exam Constitutional:      Appearance: Normal appearance. She is obese.  HENT:     Head: Normocephalic and atraumatic.     Mouth/Throat:     Mouth: Mucous membranes are moist.     Comments: Last dental exam 2021 Eyes:     Conjunctiva/sclera: Conjunctivae normal.  Neck:     Thyroid: No thyroid mass, thyromegaly or thyroid tenderness.  Cardiovascular:     Rate and Rhythm: Normal rate and regular rhythm.  Pulmonary:     Effort: Pulmonary effort is normal.     Breath sounds: Normal breath sounds.  Chest:  Breasts:    Right: Normal.     Left: Normal.  Abdominal:     Palpations: Abdomen is soft.     Comments: Soft without  masses or tenderness, poor tone  Genitourinary:    Pubic Area: No pubic lice.      Comments: Pt declines pelvic exam; denies sxs vaginitis, monogomous, pap not due Musculoskeletal:        General: Normal range of motion.     Cervical back: Normal range of motion and neck supple.  Skin:    General: Skin is warm and dry.  Neurological:     Mental Status: She is alert.  Psychiatric:        Mood and Affect: Mood normal.       Assessment and Plan:  DA [name] is a 42 y.o. female presenting to the [facility] Department for an initial annual wellness/contraceptive visit  Contraception counseling: Reviewed options based  on patient desire and reproductive life plan. Patient is interested in Hormonal Injection. This was provided to the patient today.  if not why not clearly documented  Risks, benefits, and typical effectiveness rates were reviewed.  Questions were answered.  Written information was also given to the patient to review.    The patient will follow up in  11-13 weeks for surveillance.  The patient was told to call with any further questions, or with any concerns about this method of contraception.  Emphasized use of condoms 100% of the time for STI prevention.  Educated on ECP and assessed for need of ECP. Patient reported not meeting criteria.  Reviewed options and patient desired No method of ECP, declined all    1. Family planning Pt encouraged to have primary care [facility]) order her first mammogram because she has insurance Pt desires contact card for Kathreen Cosier, LCSW  - medroxyPROGESTERone (DEPO-PROVERA) injection 150 mg  2. Encounter for surveillance of injectable contraceptive DMPA 150 mg IM q 11-13 wks x 1 year  - medroxyPROGESTERone (DEPO-PROVERA) injection 150 mg   Return for 11-13 wk DMPA.  No future appointments.  Alberteen Spindle, CNM

## 2023-08-24 NOTE — Progress Notes (Signed)
Patient is here for [facility] visit. FP packet given to patient and contents reviewed. Condoms declined. Depo given in RUQ, tolerated well. Reminder Card given. Sonda Primes, RN.

## 2023-11-09 ENCOUNTER — Ambulatory Visit: Payer: Medicaid Other

## 2023-11-09 VITALS — BP 131/82 | Ht 64.0 in | Wt 212.0 lb

## 2023-11-09 DIAGNOSIS — Z30013 Encounter for initial prescription of injectable contraceptive: Secondary | ICD-10-CM | POA: Diagnosis not present

## 2023-11-09 DIAGNOSIS — Z3042 Encounter for surveillance of injectable contraceptive: Secondary | ICD-10-CM

## 2023-11-09 DIAGNOSIS — Z3009 Encounter for other general counseling and advice on contraception: Secondary | ICD-10-CM

## 2023-11-09 DIAGNOSIS — Z309 Encounter for contraceptive management, unspecified: Secondary | ICD-10-CM

## 2023-11-09 NOTE — Progress Notes (Signed)
11 weeks post depo.  Denies any problems with method.  See flow sheet.  Depo given IM LUOQ per order by E. Sciora CNM dated 08/24/23; tolerated well.  Next depo due 01/25/24; appt reminder given.  Cherlynn Polo, RN

## 2024-01-25 ENCOUNTER — Ambulatory Visit

## 2024-01-30 ENCOUNTER — Ambulatory Visit (LOCAL_COMMUNITY_HEALTH_CENTER)

## 2024-01-30 VITALS — BP 131/75 | Ht 64.0 in | Wt 212.0 lb

## 2024-01-30 DIAGNOSIS — Z30013 Encounter for initial prescription of injectable contraceptive: Secondary | ICD-10-CM

## 2024-01-30 DIAGNOSIS — Z3009 Encounter for other general counseling and advice on contraception: Secondary | ICD-10-CM

## 2024-01-30 DIAGNOSIS — Z3042 Encounter for surveillance of injectable contraceptive: Secondary | ICD-10-CM

## 2024-01-30 DIAGNOSIS — Z309 Encounter for contraceptive management, unspecified: Secondary | ICD-10-CM

## 2024-01-30 NOTE — Progress Notes (Signed)
 11 Weeks   5 Days since last Depo   Voices no concerns today.  Counseled to adhere to 11 to 13 week intervals between depo injections for optimal benefit.  Depo given today per order by Azzie Bollman, CNM  dated 08/24/2023.  Tolerated well RUOQ.  Next depo due 04/16/2024 has reminder card.  Aniken Monestime, RN

## 2024-04-18 ENCOUNTER — Ambulatory Visit

## 2024-04-18 VITALS — BP 137/81 | Ht 64.0 in | Wt 210.5 lb

## 2024-04-18 DIAGNOSIS — Z3042 Encounter for surveillance of injectable contraceptive: Secondary | ICD-10-CM

## 2024-04-18 DIAGNOSIS — Z30013 Encounter for initial prescription of injectable contraceptive: Secondary | ICD-10-CM | POA: Diagnosis not present

## 2024-04-18 DIAGNOSIS — Z3009 Encounter for other general counseling and advice on contraception: Secondary | ICD-10-CM

## 2024-04-18 DIAGNOSIS — Z309 Encounter for contraceptive management, unspecified: Secondary | ICD-10-CM | POA: Diagnosis not present

## 2024-04-18 NOTE — Progress Notes (Signed)
 11 Weeks   2 Days since last Depo    Voices no concerns today.  Counseled to adhere to 11 to 13 week intervals between depo injections for optimal benefit.  Depo given today per order by FORBES Hillier, CNM  dated 08/24/2023.  Tolerated well LUOQ.  Next depo due 07/04/2024,  has reminder card.  Brynden Thune, RN

## 2024-07-19 ENCOUNTER — Ambulatory Visit

## 2024-07-19 VITALS — BP 130/81 | Ht 64.0 in | Wt 214.5 lb

## 2024-07-19 DIAGNOSIS — Z30013 Encounter for initial prescription of injectable contraceptive: Secondary | ICD-10-CM | POA: Diagnosis not present

## 2024-07-19 DIAGNOSIS — Z309 Encounter for contraceptive management, unspecified: Secondary | ICD-10-CM | POA: Diagnosis not present

## 2024-07-19 DIAGNOSIS — Z3042 Encounter for surveillance of injectable contraceptive: Secondary | ICD-10-CM

## 2024-07-19 MED ORDER — MEDROXYPROGESTERONE ACETATE 150 MG/ML IM SUSP
150.0000 mg | Freq: Once | INTRAMUSCULAR | Status: AC
Start: 2024-07-19 — End: 2024-07-19
  Administered 2024-07-19: 150 mg via INTRAMUSCULAR

## 2024-07-19 NOTE — Progress Notes (Signed)
 13 weeks 1 day post depo. Voices no concerns. Depo given per order by Dr. KYM Helling; order written 08/24/2023. Given left deltoid, tolerated well. Next Depo due 10/04/2024, reminder card given.  Samantha KATHEE Glatter, RN

## 2024-10-09 ENCOUNTER — Ambulatory Visit

## 2024-10-09 VITALS — BP 134/75 | Ht 64.0 in | Wt 215.5 lb

## 2024-10-09 DIAGNOSIS — Z30013 Encounter for initial prescription of injectable contraceptive: Secondary | ICD-10-CM | POA: Diagnosis not present

## 2024-10-09 DIAGNOSIS — Z309 Encounter for contraceptive management, unspecified: Secondary | ICD-10-CM | POA: Diagnosis not present

## 2024-10-09 DIAGNOSIS — Z3042 Encounter for surveillance of injectable contraceptive: Secondary | ICD-10-CM

## 2024-10-09 DIAGNOSIS — Z3009 Encounter for other general counseling and advice on contraception: Secondary | ICD-10-CM

## 2024-10-09 MED ORDER — MEDROXYPROGESTERONE ACETATE 150 MG/ML IM SUSP
150.0000 mg | Freq: Once | INTRAMUSCULAR | Status: AC
  Administered 2024-10-09: 150 mg via INTRAMUSCULAR

## 2024-10-09 NOTE — Progress Notes (Signed)
 11 weeks 5 days post last depo Voices no concerns Depo given today per verbal order CHARLENA Satchel, NP one time. Depo given IM RUOQ. Pt's last annual 08/24/2023 was due. Advised pt next appt has to have appt with provider and can get depo that same day . Due for next depo 12/26/23.
